# Patient Record
Sex: Female | Born: 1960
Health system: Southern US, Community
[De-identification: ages and names within clinical notes are randomized; demographics above are authoritative.]

## PROBLEM LIST (undated history)

## (undated) DIAGNOSIS — D259 Leiomyoma of uterus, unspecified: Secondary | ICD-10-CM

## (undated) DIAGNOSIS — M1990 Unspecified osteoarthritis, unspecified site: Secondary | ICD-10-CM

## (undated) DIAGNOSIS — N135 Crossing vessel and stricture of ureter without hydronephrosis: Secondary | ICD-10-CM

## (undated) DIAGNOSIS — K682 Retroperitoneal fibrosis: Secondary | ICD-10-CM

## (undated) DIAGNOSIS — L0390 Cellulitis, unspecified: Secondary | ICD-10-CM

## (undated) DIAGNOSIS — I10 Essential (primary) hypertension: Secondary | ICD-10-CM

## (undated) DIAGNOSIS — R569 Unspecified convulsions: Secondary | ICD-10-CM

## (undated) DIAGNOSIS — E669 Obesity, unspecified: Secondary | ICD-10-CM

## (undated) DIAGNOSIS — N1330 Unspecified hydronephrosis: Secondary | ICD-10-CM

## (undated) HISTORY — PX: OTHER SURGICAL HISTORY: SHX169

## (undated) HISTORY — DX: Retroperitoneal fibrosis: K68.2

## (undated) HISTORY — DX: Unspecified hydronephrosis: N13.30

## (undated) HISTORY — DX: Crossing vessel and stricture of ureter without hydronephrosis: N13.5

---

## 1988-01-16 HISTORY — PX: CERVICAL CONE BIOPSY: SUR198

## 1997-08-12 ENCOUNTER — Encounter: Admission: RE | Admit: 1997-08-12 | Discharge: 1997-08-12 | Payer: Self-pay | Admitting: Sports Medicine

## 1998-09-09 ENCOUNTER — Encounter: Admission: RE | Admit: 1998-09-09 | Discharge: 1998-09-09 | Payer: Self-pay | Admitting: Family Medicine

## 1999-10-20 ENCOUNTER — Encounter: Admission: RE | Admit: 1999-10-20 | Discharge: 1999-10-20 | Payer: Self-pay | Admitting: Family Medicine

## 1999-10-20 ENCOUNTER — Encounter: Payer: Self-pay | Admitting: Family Medicine

## 2000-11-18 ENCOUNTER — Encounter: Admission: RE | Admit: 2000-11-18 | Discharge: 2000-11-18 | Payer: Self-pay | Admitting: Family Medicine

## 2000-11-29 ENCOUNTER — Encounter: Admission: RE | Admit: 2000-11-29 | Discharge: 2000-11-29 | Payer: Self-pay | Admitting: Family Medicine

## 2000-12-06 ENCOUNTER — Encounter: Admission: RE | Admit: 2000-12-06 | Discharge: 2000-12-06 | Payer: Self-pay | Admitting: Family Medicine

## 2000-12-31 ENCOUNTER — Encounter: Admission: RE | Admit: 2000-12-31 | Discharge: 2000-12-31 | Payer: Self-pay | Admitting: Family Medicine

## 2001-01-20 ENCOUNTER — Encounter: Admission: RE | Admit: 2001-01-20 | Discharge: 2001-01-20 | Payer: Self-pay | Admitting: Family Medicine

## 2001-01-21 ENCOUNTER — Encounter: Admission: RE | Admit: 2001-01-21 | Discharge: 2001-01-21 | Payer: Self-pay | Admitting: Family Medicine

## 2001-01-29 ENCOUNTER — Encounter: Admission: RE | Admit: 2001-01-29 | Discharge: 2001-01-29 | Payer: Self-pay | Admitting: Family Medicine

## 2001-03-05 ENCOUNTER — Encounter: Admission: RE | Admit: 2001-03-05 | Discharge: 2001-03-05 | Payer: Self-pay | Admitting: Family Medicine

## 2001-09-10 ENCOUNTER — Encounter: Admission: RE | Admit: 2001-09-10 | Discharge: 2001-09-10 | Payer: Self-pay | Admitting: Family Medicine

## 2001-12-04 ENCOUNTER — Encounter: Admission: RE | Admit: 2001-12-04 | Discharge: 2001-12-04 | Payer: Self-pay | Admitting: Family Medicine

## 2001-12-04 ENCOUNTER — Encounter (INDEPENDENT_AMBULATORY_CARE_PROVIDER_SITE_OTHER): Payer: Self-pay | Admitting: *Deleted

## 2002-12-23 ENCOUNTER — Encounter: Admission: RE | Admit: 2002-12-23 | Discharge: 2002-12-23 | Payer: Self-pay | Admitting: Family Medicine

## 2003-01-06 ENCOUNTER — Encounter: Admission: RE | Admit: 2003-01-06 | Discharge: 2003-01-06 | Payer: Self-pay | Admitting: Sports Medicine

## 2004-01-21 ENCOUNTER — Encounter (INDEPENDENT_AMBULATORY_CARE_PROVIDER_SITE_OTHER): Payer: Self-pay | Admitting: *Deleted

## 2004-01-27 ENCOUNTER — Ambulatory Visit: Payer: Self-pay | Admitting: Family Medicine

## 2004-03-01 ENCOUNTER — Encounter: Admission: RE | Admit: 2004-03-01 | Discharge: 2004-03-01 | Payer: Self-pay | Admitting: Sports Medicine

## 2005-01-30 ENCOUNTER — Ambulatory Visit: Payer: Self-pay | Admitting: Family Medicine

## 2005-01-30 ENCOUNTER — Encounter: Payer: Self-pay | Admitting: Family Medicine

## 2005-03-21 ENCOUNTER — Encounter: Admission: RE | Admit: 2005-03-21 | Discharge: 2005-03-21 | Payer: Self-pay | Admitting: Family Medicine

## 2005-03-28 ENCOUNTER — Encounter: Admission: RE | Admit: 2005-03-28 | Discharge: 2005-03-28 | Payer: Self-pay | Admitting: Sports Medicine

## 2006-03-11 ENCOUNTER — Ambulatory Visit: Payer: Self-pay | Admitting: Family Medicine

## 2006-03-11 ENCOUNTER — Encounter (INDEPENDENT_AMBULATORY_CARE_PROVIDER_SITE_OTHER): Payer: Self-pay | Admitting: *Deleted

## 2006-03-11 LAB — CONVERTED CEMR LAB
Albumin: 4.1 g/dL (ref 3.5–5.2)
Alkaline Phosphatase: 49 units/L (ref 39–117)
BUN: 10 mg/dL (ref 6–23)
CO2: 22 meq/L (ref 19–32)
Calcium: 9.5 mg/dL (ref 8.4–10.5)
Cholesterol: 210 mg/dL — ABNORMAL HIGH (ref 0–200)
Glucose, Bld: 74 mg/dL (ref 70–99)
HDL: 79 mg/dL (ref >39)
Potassium: 4.1 meq/L (ref 3.5–5.3)
Sodium: 138 meq/L (ref 135–145)
Total Protein: 7 g/dL (ref 6.0–8.3)
Triglycerides: 157 mg/dL — ABNORMAL HIGH (ref <150)

## 2006-03-14 DIAGNOSIS — B351 Tinea unguium: Secondary | ICD-10-CM | POA: Insufficient documentation

## 2006-03-14 DIAGNOSIS — E669 Obesity, unspecified: Secondary | ICD-10-CM | POA: Insufficient documentation

## 2006-03-14 DIAGNOSIS — I10 Essential (primary) hypertension: Secondary | ICD-10-CM | POA: Insufficient documentation

## 2006-03-14 HISTORY — DX: Essential (primary) hypertension: I10

## 2006-03-15 ENCOUNTER — Encounter (INDEPENDENT_AMBULATORY_CARE_PROVIDER_SITE_OTHER): Payer: Self-pay | Admitting: *Deleted

## 2006-04-04 ENCOUNTER — Encounter: Admission: RE | Admit: 2006-04-04 | Discharge: 2006-04-04 | Payer: Self-pay | Admitting: Sports Medicine

## 2006-04-04 ENCOUNTER — Encounter (INDEPENDENT_AMBULATORY_CARE_PROVIDER_SITE_OTHER): Payer: Self-pay | Admitting: *Deleted

## 2007-03-11 ENCOUNTER — Encounter: Payer: Self-pay | Admitting: *Deleted

## 2007-03-24 ENCOUNTER — Ambulatory Visit: Payer: Self-pay | Admitting: Sports Medicine

## 2007-03-24 ENCOUNTER — Encounter: Payer: Self-pay | Admitting: Family Medicine

## 2007-03-24 LAB — CONVERTED CEMR LAB
BUN: 12 mg/dL (ref 6–23)
CO2: 25 meq/L (ref 19–32)
Chloride: 98 meq/L (ref 96–112)
Creatinine, Ser: 0.71 mg/dL (ref 0.40–1.20)
Glucose, Bld: 84 mg/dL (ref 70–99)
LDL Cholesterol: 92 mg/dL (ref 0–99)
Potassium: 4.3 meq/L (ref 3.5–5.3)
Triglycerides: 83 mg/dL (ref <150)
VLDL: 17 mg/dL (ref 0–40)

## 2007-04-15 ENCOUNTER — Encounter: Admission: RE | Admit: 2007-04-15 | Discharge: 2007-04-15 | Payer: Self-pay | Admitting: Family Medicine

## 2008-01-25 ENCOUNTER — Encounter: Payer: Self-pay | Admitting: Family Medicine

## 2008-01-26 ENCOUNTER — Ambulatory Visit: Payer: Self-pay | Admitting: Family Medicine

## 2008-01-26 ENCOUNTER — Encounter (INDEPENDENT_AMBULATORY_CARE_PROVIDER_SITE_OTHER): Payer: Self-pay | Admitting: Family Medicine

## 2008-01-28 ENCOUNTER — Encounter: Payer: Self-pay | Admitting: Family Medicine

## 2008-02-19 ENCOUNTER — Telehealth: Payer: Self-pay | Admitting: *Deleted

## 2008-02-20 ENCOUNTER — Telehealth: Payer: Self-pay | Admitting: Family Medicine

## 2009-03-11 ENCOUNTER — Encounter: Payer: Self-pay | Admitting: Family Medicine

## 2009-03-11 ENCOUNTER — Ambulatory Visit: Payer: Self-pay | Admitting: Family Medicine

## 2009-03-11 LAB — CONVERTED CEMR LAB
CO2: 24 meq/L (ref 19–32)
Chloride: 104 meq/L (ref 96–112)
Creatinine, Ser: 0.64 mg/dL (ref 0.40–1.20)
HDL: 81 mg/dL (ref >39)
LDL Cholesterol: 85 mg/dL (ref 0–99)
Potassium: 4 meq/L (ref 3.5–5.3)
Sodium: 140 meq/L (ref 135–145)
Total CHOL/HDL Ratio: 2.2
Triglycerides: 54 mg/dL (ref <150)

## 2009-03-13 ENCOUNTER — Encounter: Payer: Self-pay | Admitting: Family Medicine

## 2010-02-16 NOTE — Letter (Signed)
Summary: Generic Letter  [facility]  [address]   [place], [place] [address]   Phone: [phone]  Fax: [phone]    03/13/2009  Diane Clark [address] [place], [place]  [address]  Dear [name],   I just wanted to let you know that your lab results were normal.  Your cholesterol is great!  Please call me if you have any questions or concerns.           Sincerely,   Asher Muir MD  Appended Document: Generic Letter mailed.

## 2010-02-16 NOTE — Assessment & Plan Note (Signed)
Summary: cpe/pap,tcb   Vital Signs:  Patient profile:   50 year old female Weight:      200 pounds Temp:     97.6 degrees F Pulse rate:   69 / minute BP sitting:   121 / 81  Vitals Entered By: Loralee Pacas CMA (March 11, 2009 9:41 AM)  Serial Vital Signs/Assessments:  Time      Position  BP       Pulse  Resp  Temp     By                     102/65                         Asher Muir MD   CC:  cpe.  History of Present Illness: Here for cpe  Current Medications (verified): 1)  Metoprolol Succinate 100 Mg  Tb24 (Metoprolol Succinate) .... Take 1 Tablet Po Twice A Day 2)  Lisinopril 40 Mg  Tabs (Lisinopril) .... Take 1 Tablet By Mouth Dail 3)  Hydrochlorothiazide 25 Mg  Tabs (Hydrochlorothiazide) .... Take 1 Tablet By Mouth Daily  Past History:  Past Surgical History: Reviewed history from 01/26/2008 and no changes required. cervical conization `90 (no more abnl PAP`s)   Family History: B:  HTN F:  unknown M:  DM, HTN, CHF S (x4): HTN  Review of Systems General:  Denies weakness and weight loss. CV:  Denies chest pain or discomfort; no lightheadedness. Resp:  Denies cough.  Physical Exam  General:  Well-developed,well-nourished,in no acute distress; alert,appropriate and cooperative throughout examination Head:  Normocephalic and atraumatic without obvious abnormalities. No apparent alopecia or balding. Eyes:  fundoscopic exam grossly normal; PERRL Ears:  tms and canals clear Nose:  External nasal examination shows no deformity or inflammation. Nasal mucosa are pink and moist without lesions or exudates. Mouth:  Oral mucosa and oropharynx without lesions or exudates.  Teeth in good repair. Neck:  No deformities, masses, or tenderness noted. Breasts:  No mass, nodules, thickening, tenderness, bulging, retraction, inflamation, nipple discharge or skin changes noted.   Lungs:  Normal respiratory effort, chest expands symmetrically. Lungs are clear to  auscultation, no crackles or wheezes. Heart:  Normal rate and regular rhythm. S1 and S2 normal without gallop, murmur, click, rub or other extra sounds. Abdomen:  Bowel sounds positive,abdomen soft and non-tender without masses, organomegaly or hernias noted. Genitalia:  Pelvic Exam:        External: normal female genitalia without lesions or masses               Adnexa: normal bimanual exam without masses or fullness        Uterus: normal by palpation        Pap smear: not performed Msk:  No deformity or scoliosis noted of thoracic or lumbar spine.   Extremities:  No clubbing, cyanosis, edema, or deformity noted Neurologic:  no focal deficits Skin:  multiple flat moles (no changes per pt) Cervical Nodes:  No lymphadenopathy noted Axillary Nodes:  No palpable lymphadenopathy Psych:  Cognition and judgment appear intact. Alert and cooperative with normal attention span and concentration. No apparent delusions, illusions, hallucinations Additional Exam:  vital signs reviewed    Impression & Recommendations:  Problem # 1:  WELL WOMAN (ICD-V70.0) Assessment Unchanged  healthy.  advised exercise.  will think about colonoscopy.  due for mammo.  mammo sheet given.  check ldl today.  think she could go  to paps every 3 years, but she did have hx of abnormal paps with a conization >10 years ago.  all subsequent paps normal.  she prefers to have pap every2 years which I think is reasonable   Orders: [facility] - Est  40-64 yrs ([address])  Problem # 2:  OBESITY, NOS (ICD-278.00) Assessment: Unchanged  advised exercise  Orders: [facility] - Est  40-64 yrs ([address])  Problem # 3:  HYPERTENSION, BENIGN SYSTEMIC (ICD-401.1) Assessment: Unchanged  great control.  we had her metop listed as succinate, but she is actually taking metop tartrate. Her updated medication list for this problem includes:    Metoprolol Tartrate 100 Mg Tabs (Metoprolol tartrate) .Marland Kitchen... 1 tablet by mouth two times a day for blood  pressure    Lisinopril 40 Mg Tabs (Lisinopril) .Marland Kitchen... Take 1 tablet by mouth dail    Hydrochlorothiazide 25 Mg Tabs (Hydrochlorothiazide) .Marland Kitchen... Take 1 tablet by mouth daily  Orders: T-Basic Metabolic Panel [id number]) [facility] - Est  40-64 yrs ([address])  Problem # 4:  CONTRACEPTIVE MANAGEMENT (ICD-V25.09) Assessment: Unchanged think she should continue ocps for next few years.  then stop and see if in menopause.  does not think she would use condoms regularly.  not a smoker.  no hx of clots  Complete Medication List: 1)  Metoprolol Tartrate 100 Mg Tabs (Metoprolol tartrate) .Marland Kitchen.. 1 tablet by mouth two times a day for blood pressure 2)  Lisinopril 40 Mg Tabs (Lisinopril) .... Take 1 tablet by mouth dail 3)  Hydrochlorothiazide 25 Mg Tabs (Hydrochlorothiazide) .... Take 1 tablet by mouth daily 4)  Nortrel 1/35 (21) 1-35 Mg-mcg Tabs (Norethindrone-eth estradiol) .Marland Kitchen.. 1 tab by mouth daily for birth control; dispense 1 pack  Other Orders: Mammogram (Screening) (Mammo) T-Lipid Profile ([id number])  Patient Instructions: 1)  It was nice to see you today. 2)  You are very healthy.  Keep up the good work. 3)  Exercising would be great for you. 4)  Let us know if you decide you would like to get a colonoscopy. 5)  Be sure to get your mammogram. 6)  Please schedule a follow-up appointment in 6 months for blood pressure .   Prevention & Chronic Care Immunizations   Influenza vaccine: Not documented    Tetanus booster: 01/26/2008: Tdap   Tetanus booster due: 08/15/2008    Pneumococcal vaccine: Not documented  Colorectal Screening   Hemoccult: Done.  (11/15/2001)   Hemoccult due: 11/16/2002    Colonoscopy: Not documented   Colonoscopy action/deferral: Deferred  (03/11/2009)  Other Screening   Pap smear: NEGATIVE FOR INTRAEPITHELIAL LESIONS OR MALIGNANCY.  (01/26/2008)   Pap smear due: 01/25/2010    Mammogram: Normal  (04/17/2007)   Mammogram action/deferral: Ordered  (03/11/2009)    Mammogram due: 04/2008   Smoking status: quit  (01/26/2008)  Lipids   Total Cholesterol: 194  (03/24/2007)   Lipid panel action/deferral: Lipid Panel ordered   LDL: 92  (03/24/2007)   LDL Direct: Not documented   HDL: 85  (03/24/2007)   Triglycerides: 83  (03/24/2007)  Hypertension   Last Blood Pressure: 121 / 81  (03/11/2009)   Serum creatinine: 0.71  (03/24/2007)   BMP action: Ordered   Serum potassium 4.3  (03/24/2007)    Hypertension flowsheet reviewed?: Yes   Progress toward BP goal: At goal    Stage of readiness to change (hypertension management): Action   Hypertension comments: repeat bp 102/65  Self-Management Support :   Personal Goals (by the next clinic visit) :  Personal blood pressure goal: 140/90  (03/11/2009)   Hypertension self-management support: Not documented    Hypertension self-management support not done because: Good outcomes  (03/11/2009)   Nursing Instructions: Schedule screening mammogram (see order)

## 2010-03-14 ENCOUNTER — Other Ambulatory Visit: Payer: Self-pay | Admitting: Family Medicine

## 2010-03-14 ENCOUNTER — Encounter: Payer: Self-pay | Admitting: Family Medicine

## 2010-03-14 ENCOUNTER — Ambulatory Visit (INDEPENDENT_AMBULATORY_CARE_PROVIDER_SITE_OTHER): Payer: BC Managed Care – PPO | Admitting: Family Medicine

## 2010-03-14 DIAGNOSIS — B351 Tinea unguium: Secondary | ICD-10-CM

## 2010-03-14 DIAGNOSIS — E28319 Asymptomatic premature menopause: Secondary | ICD-10-CM

## 2010-03-14 DIAGNOSIS — Z780 Asymptomatic menopausal state: Secondary | ICD-10-CM

## 2010-03-14 DIAGNOSIS — N951 Menopausal and female climacteric states: Secondary | ICD-10-CM

## 2010-03-14 DIAGNOSIS — I10 Essential (primary) hypertension: Secondary | ICD-10-CM

## 2010-03-14 DIAGNOSIS — Z0000 Encounter for general adult medical examination without abnormal findings: Secondary | ICD-10-CM

## 2010-03-14 DIAGNOSIS — Z1231 Encounter for screening mammogram for malignant neoplasm of breast: Secondary | ICD-10-CM

## 2010-03-14 LAB — CONVERTED CEMR LAB
BUN: 14 mg/dL (ref 6–23)
CO2: 24 meq/L (ref 19–32)
Creatinine, Ser: 0.71 mg/dL (ref 0.40–1.20)
FSH: 5.4 milliintl units/mL
Glucose, Bld: 82 mg/dL (ref 70–99)
HCT: 40.4 % (ref 36.0–46.0)
LH: 1.6 milliintl units/mL
MCV: 89.8 fL (ref 78.0–100.0)
RBC: 4.5 M/uL (ref 3.87–5.11)
Total Bilirubin: 0.3 mg/dL (ref 0.3–1.2)
WBC: 6.5 10*3/uL (ref 4.0–10.5)

## 2010-03-14 LAB — CBC
HCT: 40.4 % (ref 36.0–46.0)
MCHC: 33.4 g/dL (ref 30.0–36.0)
Platelets: 291 10*3/uL (ref 150–400)
RDW: 13.4 % (ref 11.5–15.5)

## 2010-03-14 LAB — COMPREHENSIVE METABOLIC PANEL
ALT: 16 U/L (ref 0–35)
AST: 18 U/L (ref 0–37)
Alkaline Phosphatase: 52 U/L (ref 39–117)
BUN: 14 mg/dL (ref 6–23)
Creat: 0.71 mg/dL (ref 0.40–1.20)
Potassium: 4.4 mEq/L (ref 3.5–5.3)

## 2010-03-14 MED ORDER — FLUCONAZOLE 150 MG PO TABS: 150.0000 mg | ORAL_TABLET | Freq: Once | ORAL | Status: DC

## 2010-03-19 NOTE — Progress Notes (Signed)
  Subjective:    Patient ID: Diane Clark, female    DOB: 04/22/1960, 50 y.o.   MRN: [id number]  HPI Patient presents to clinic for a complete physical exam.  Patient doing well.  Only complains of feeling light-headed occasionally.  No associated symptoms.  Patient believes it could be due to anti-hypertensives.  Patient denies any recent illnesses or visits to ED/hospital.  Wants to discuss onychomycosis which is a chronic issue.  Has declined anti-fungal meds in the past due to side effects and close monitoring.  Does not want symptoms to become worse.  Patient had a pap smear last year which was normal.  She agrees to do the next pap smear in one year.  Does not need any med refills at this time.    Review of Systems Denies fever, chills, CP, N/V.  Denies abdominal pain, constipation/diarrhea, dysuria.    Objective:   Physical Exam  Constitutional: She is oriented to person, place, and time. She appears well-developed and well-nourished.  HENT:  Head: Normocephalic and atraumatic.  Eyes: Conjunctivae and EOM are normal. Pupils are equal, round, and reactive to light.  Neck: Neck supple. No JVD present.  Cardiovascular: Normal rate and regular rhythm.  Exam reveals no gallop and no friction rub.   No murmur heard. Pulmonary/Chest: Breath sounds normal. She has no wheezes. She has no rales.  Abdominal: Soft. Bowel sounds are normal. She exhibits no distension and no mass. There is no tenderness.  Musculoskeletal: Normal range of motion. She exhibits no edema.  Lymphadenopathy:    She has no cervical adenopathy.  Neurological: She is alert and oriented to person, place, and time. She has normal reflexes.  Skin: Skin is warm and dry.  Psychiatric: She has a normal mood and affect.          Assessment & Plan:

## 2010-03-20 ENCOUNTER — Telehealth: Payer: Self-pay | Admitting: Family Medicine

## 2010-03-20 DIAGNOSIS — I10 Essential (primary) hypertension: Secondary | ICD-10-CM

## 2010-03-20 MED ORDER — METOPROLOL TARTRATE 100 MG PO TABS: 100.0000 mg | ORAL_TABLET | Freq: Two times a day (BID) | ORAL | Status: DC

## 2010-03-20 MED ORDER — NORETHINDRONE-ETH ESTRADIOL 1-35 MG-MCG PO TABS: 1.0000 | ORAL_TABLET | Freq: Every day | ORAL | Status: DC

## 2010-03-20 NOTE — Telephone Encounter (Addendum)
Patient was in to see dr. Tye Savoy on 03/14/2010.  Will contact MD to ask about refills .  spoke with MD and she will enter in Rx and [facility].   patient also needs RX for lisinopril send locally to [facility] on [address] because she will not have enough to last until she [facility]. Will ask MD to do this also.

## 2010-03-20 NOTE — Telephone Encounter (Signed)
States that [facility] have [facility]ed in request for her meds and haven't heard back.  She had just talked to them and they told her that we would have to fax in refill: Lisinopril Diflucan Metoprolol HCTZ Nortel  Her member ID# [id number] Fax # [phone]

## 2010-03-21 DIAGNOSIS — Z0000 Encounter for general adult medical examination without abnormal findings: Secondary | ICD-10-CM | POA: Insufficient documentation

## 2010-03-21 MED ORDER — HYDROCHLOROTHIAZIDE 25 MG PO TABS: 25.0000 mg | ORAL_TABLET | Freq: Every day | ORAL | Status: DC

## 2010-03-21 MED ORDER — LISINOPRIL 40 MG PO TABS: 40.0000 mg | ORAL_TABLET | Freq: Every day | ORAL | Status: DC

## 2010-03-21 MED ORDER — METOPROLOL TARTRATE 100 MG PO TABS: 100.0000 mg | ORAL_TABLET | Freq: Two times a day (BID) | ORAL | Status: DC

## 2010-03-21 NOTE — Telephone Encounter (Signed)
Patient notified that rx have been refilled electronically  to [facility] and RN sent in rx to local pharmacy for lisinopril.

## 2010-03-21 NOTE — Assessment & Plan Note (Signed)
Patient's condition is moderate.  Onychomycosis limited to L big toe and 2nd toe.  Will call pharmacy and give verbal order for Diflucan 150mg  1 tab per week for 9 months.  Patient agreed to Diflucan because it has less side effects and monitoring for LFTs.  Will follow up in 4 months.

## 2010-04-14 ENCOUNTER — Telehealth: Payer: Self-pay | Admitting: Family Medicine

## 2010-04-14 NOTE — Telephone Encounter (Signed)
Needs refill for 90 days on her Nortrel - can't have it as 3 refills, must be for 90 days - [facility] - fax [phone] Phn# [phone]  Diflucan-they will on only pay for 2 tablets at a time, needs it called again for 2 more or however she can help her get it for 9 months.  The doctor may have to call and speak with pharmacy

## 2010-05-08 ENCOUNTER — Telehealth: Payer: Self-pay | Admitting: Family Medicine

## 2010-05-08 DIAGNOSIS — I10 Essential (primary) hypertension: Secondary | ICD-10-CM

## 2010-05-08 MED ORDER — LISINOPRIL 40 MG PO TABS: 40.0000 mg | ORAL_TABLET | Freq: Every day | ORAL | Status: DC

## 2010-05-08 MED ORDER — METOPROLOL TARTRATE 100 MG PO TABS: 100.0000 mg | ORAL_TABLET | Freq: Two times a day (BID) | ORAL | Status: DC

## 2010-05-08 MED ORDER — NORETHINDRONE-ETH ESTRADIOL 1-35 MG-MCG PO TABS: 1.0000 | ORAL_TABLET | Freq: Every day | ORAL | Status: DC

## 2010-05-08 MED ORDER — FLUCONAZOLE 150 MG PO TABS: 150.0000 mg | ORAL_TABLET | Freq: Once | ORAL | Status: DC

## 2010-05-08 NOTE — Telephone Encounter (Signed)
Pt uses [facility] and she needs enough meds to last her for the year - she was here 2/28 and had spoken to the doc about it.  All of her meds needs to be sent in for at least a 90 day supply and then refills for the rest of the year. Even the Diflucan needs to be changed.

## 2010-05-08 NOTE — Telephone Encounter (Signed)
Hi Denise, I re-ordered all of her meds and sent electronically to pharmacy.  Quantity: 90 with 12 refills.  Please let patient know she can pick them up whenever the pharmacy is ready. Thanks, [employer]

## 2010-06-08 ENCOUNTER — Telehealth: Payer: Self-pay | Admitting: Family Medicine

## 2010-06-08 ENCOUNTER — Other Ambulatory Visit: Payer: Self-pay | Admitting: Family Medicine

## 2010-06-08 DIAGNOSIS — I10 Essential (primary) hypertension: Secondary | ICD-10-CM

## 2010-06-08 MED ORDER — LISINOPRIL 40 MG PO TABS: 40.0000 mg | ORAL_TABLET | Freq: Every day | ORAL | Status: DC

## 2010-06-08 MED ORDER — METOPROLOL TARTRATE 100 MG PO TABS: 100.0000 mg | ORAL_TABLET | Freq: Two times a day (BID) | ORAL | Status: DC

## 2010-06-08 NOTE — Telephone Encounter (Signed)
Pt calling re: all her meds, says MD sent in refills wrong, pt likes to get 3 month supplies & the qty needs to be increased to meet the 3 month supply & include additional refills. Please call pt with questions, [facility]. Pt specifically needs metoprolol 2 x daily qty 180 with 3 refills.

## 2010-06-08 NOTE — Telephone Encounter (Signed)
I refilled metoprolol and lisinopril electronically, 3 mo supply, 3 refills to [facility] as patient requests.

## 2010-06-09 NOTE — Telephone Encounter (Signed)
lvm the Rz request has been sent.Laureen Ochs, Viann Shove

## 2010-06-15 ENCOUNTER — Other Ambulatory Visit: Payer: Self-pay | Admitting: Anesthesiology

## 2010-06-15 ENCOUNTER — Other Ambulatory Visit: Payer: Self-pay | Admitting: Family Medicine

## 2010-06-15 DIAGNOSIS — Z1231 Encounter for screening mammogram for malignant neoplasm of breast: Secondary | ICD-10-CM

## 2010-06-22 ENCOUNTER — Ambulatory Visit
Admission: RE | Admit: 2010-06-22 | Discharge: 2010-06-22 | Disposition: A | Discharge status: Home / Self Care | Payer: BC Managed Care – PPO | Source: Ambulatory Visit | Attending: Family Medicine | Admitting: Family Medicine

## 2010-06-22 DIAGNOSIS — Z1231 Encounter for screening mammogram for malignant neoplasm of breast: Secondary | ICD-10-CM

## 2010-06-26 ENCOUNTER — Telehealth: Payer: Self-pay | Admitting: Family Medicine

## 2010-06-26 ENCOUNTER — Telehealth: Payer: Self-pay | Admitting: *Deleted

## 2010-06-26 MED ORDER — NORETHINDRONE-ETH ESTRADIOL 1-35 MG-MCG PO TABS: 1.0000 | ORAL_TABLET | Freq: Every day | ORAL | Status: DC

## 2010-06-26 NOTE — Telephone Encounter (Signed)
Ms. Balash has called because the prescription for Nortel/Cyclesem was written incorrectly.  The prescription needs to read for 84 tablets and 3 refills and needs to be faxed to [facility] at [phone].

## 2010-06-26 NOTE — Telephone Encounter (Signed)
Ms. Gorum has called because the prescription for Nortel/Cyclesem was written incorrectly.  The prescription needs to read for 84 tablets and 3 refills and needs to be faxed to [facility] at [phone]. °

## 2011-02-20 ENCOUNTER — Ambulatory Visit (INDEPENDENT_AMBULATORY_CARE_PROVIDER_SITE_OTHER): Payer: BC Managed Care – PPO | Admitting: Family Medicine

## 2011-02-20 ENCOUNTER — Encounter: Payer: Self-pay | Admitting: Family Medicine

## 2011-02-20 DIAGNOSIS — B351 Tinea unguium: Secondary | ICD-10-CM

## 2011-02-20 DIAGNOSIS — I10 Essential (primary) hypertension: Secondary | ICD-10-CM

## 2011-02-20 LAB — CBC
MCV: 89.4 fL (ref 78.0–100.0)
Platelets: 294 10*3/uL (ref 150–400)
RBC: 4.51 MIL/uL (ref 3.87–5.11)
WBC: 5.5 10*3/uL (ref 4.0–10.5)

## 2011-02-20 LAB — COMPREHENSIVE METABOLIC PANEL
ALT: 15 U/L (ref 0–35)
AST: 15 U/L (ref 0–37)
Albumin: 4.2 g/dL (ref 3.5–5.2)
Calcium: 9.1 mg/dL (ref 8.4–10.5)
Chloride: 101 mEq/L (ref 96–112)
Creat: 0.61 mg/dL (ref 0.50–1.10)
Potassium: 4 mEq/L (ref 3.5–5.3)
Sodium: 137 mEq/L (ref 135–145)
Total Protein: 7 g/dL (ref 6.0–8.3)

## 2011-02-20 LAB — LIPID PANEL: Total CHOL/HDL Ratio: 2.6 Ratio

## 2011-02-20 MED ORDER — METOPROLOL TARTRATE 100 MG PO TABS: 100.0000 mg | ORAL_TABLET | Freq: Two times a day (BID) | ORAL | Status: DC

## 2011-02-20 MED ORDER — LISINOPRIL 40 MG PO TABS: 40.0000 mg | ORAL_TABLET | Freq: Every day | ORAL | Status: DC

## 2011-02-20 MED ORDER — HYDROCHLOROTHIAZIDE 25 MG PO TABS: 25.0000 mg | ORAL_TABLET | Freq: Every day | ORAL | Status: DC

## 2011-02-20 NOTE — Assessment & Plan Note (Signed)
Due to patient's medical noncompliance and hesitancy to take oral medication, will refer to podiatry.

## 2011-02-20 NOTE — Progress Notes (Signed)
  Subjective:    Patient ID: Diane Clark, female    DOB: [birth year], 51 y.o.   MRN: [id number]  HPI  Patient presents to clinic for medication refills and toe nail fungus followup.  Hypertension: Patient has not been to clinic in over a year. She has been getting refills of her antihypertensive medications. Currently taking lisinopril 40 mg daily, metoprolol 100 mg twice daily, and hydrochlorothiazide 25 mg daily. Current blood pressure today 128/76. Patient denies any headache, nausea or vomiting, blurry vision, chest pain, fatigue, numbness or tingling of extremities. Patient has no complaints and denies any side effects of medications.  Onychomycosis: Patient was to take Diflucan one tablet per week for 9 months. Patient was not interested in taking any other antifungal medication at last visit. Patient admits to not taking Diflucan every week. She is still not interested in taking any other oral antifungal medication. States for fungus has not improved. Denies any foot pain or worsening symptoms.  Review of Systems  Her history of present illness    Objective:   Physical Exam  Constitutional: No distress.  HENT:  Head: Normocephalic and atraumatic.  Neck: Normal range of motion. No JVD present.  Cardiovascular: Normal rate and normal heart sounds.   Pulmonary/Chest: Effort normal and breath sounds normal.  Musculoskeletal: Normal range of motion. She exhibits no edema.  Skin: Skin is warm and dry.  Psychiatric: Her mood appears anxious.  Foot exam: Toenail fungus appreciated on on all 5 toenails, left foot.        Assessment & Plan:

## 2011-02-20 NOTE — Assessment & Plan Note (Signed)
Blood pressure well controlled. Continue current regimen.  

## 2011-02-20 NOTE — Patient Instructions (Signed)
It was good to see you today. I will send you a letter with recent lab results. Pick up medications at [facility] and take as directed. We will call you for time and date of podiatry appointment. You are due for a complete physical - schedule one at your earliest convenience. If you experience headache, vomiting, blurry vision, chest pain, or SOB, please call MD or go to ED.

## 2011-02-21 ENCOUNTER — Encounter: Payer: Self-pay | Admitting: Family Medicine

## 2011-11-08 ENCOUNTER — Other Ambulatory Visit: Payer: Self-pay | Admitting: Family Medicine

## 2012-03-10 ENCOUNTER — Other Ambulatory Visit: Payer: Self-pay | Admitting: Family Medicine

## 2012-10-29 ENCOUNTER — Other Ambulatory Visit: Payer: Self-pay | Admitting: Family Medicine

## 2013-01-29 ENCOUNTER — Other Ambulatory Visit: Payer: Self-pay | Admitting: Family Medicine

## 2013-01-29 ENCOUNTER — Telehealth: Payer: Self-pay | Admitting: Family Medicine

## 2013-01-29 MED ORDER — LISINOPRIL 40 MG PO TABS: 40.0000 mg | ORAL_TABLET | Freq: Every day | ORAL | Status: DC

## 2013-01-29 NOTE — Telephone Encounter (Signed)
LVM for patient to call back. ?

## 2013-01-29 NOTE — Telephone Encounter (Signed)
Please cal pt and inform I will refill her prescription request. She MUST make an appointment to be seen. She has not been seen in almost 2 years. Thanks.

## 2013-03-26 ENCOUNTER — Other Ambulatory Visit: Payer: Self-pay | Admitting: Family Medicine

## 2013-03-31 ENCOUNTER — Other Ambulatory Visit: Payer: Self-pay | Admitting: Podiatry

## 2013-04-02 ENCOUNTER — Other Ambulatory Visit: Payer: Self-pay | Admitting: Podiatry

## 2013-04-09 ENCOUNTER — Encounter: Payer: Self-pay | Admitting: Family Medicine

## 2013-04-09 ENCOUNTER — Other Ambulatory Visit (HOSPITAL_COMMUNITY)
Admission: RE | Admit: 2013-04-09 | Discharge: 2013-04-09 | Disposition: A | Discharge status: Home / Self Care | Payer: BC Managed Care – PPO | Source: Ambulatory Visit | Attending: Family Medicine | Admitting: Family Medicine

## 2013-04-09 ENCOUNTER — Ambulatory Visit (INDEPENDENT_AMBULATORY_CARE_PROVIDER_SITE_OTHER): Payer: BC Managed Care – PPO | Admitting: Family Medicine

## 2013-04-09 VITALS — BP 120/59 | HR 68 | Temp 98.1°F | Ht 67.0 in | Wt 207.0 lb

## 2013-04-09 DIAGNOSIS — Z01419 Encounter for gynecological examination (general) (routine) without abnormal findings: Secondary | ICD-10-CM | POA: Insufficient documentation

## 2013-04-09 DIAGNOSIS — Z124 Encounter for screening for malignant neoplasm of cervix: Secondary | ICD-10-CM

## 2013-04-09 DIAGNOSIS — I10 Essential (primary) hypertension: Secondary | ICD-10-CM

## 2013-04-09 DIAGNOSIS — Z0000 Encounter for general adult medical examination without abnormal findings: Secondary | ICD-10-CM

## 2013-04-09 LAB — CBC
HEMATOCRIT: 39.5 % (ref 36.0–46.0)
HEMOGLOBIN: 13.5 g/dL (ref 12.0–15.0)
MCH: 29.2 pg (ref 26.0–34.0)
MCHC: 34.2 g/dL (ref 30.0–36.0)
MCV: 85.5 fL (ref 78.0–100.0)
Platelets: 298 10*3/uL (ref 150–400)
RBC: 4.62 MIL/uL (ref 3.87–5.11)
RDW: 13.9 % (ref 11.5–15.5)
WBC: 4.5 10*3/uL (ref 4.0–10.5)

## 2013-04-09 LAB — LIPID PANEL
Cholesterol: 225 mg/dL — ABNORMAL HIGH (ref 0–200)
HDL: 96 mg/dL (ref >39)
LDL Cholesterol: 119 mg/dL — ABNORMAL HIGH (ref 0–99)
Total CHOL/HDL Ratio: 2.3 Ratio
Triglycerides: 52 mg/dL (ref <150)
VLDL: 10 mg/dL (ref 0–40)

## 2013-04-09 LAB — COMPLETE METABOLIC PANEL WITH GFR
ALBUMIN: 4.2 g/dL (ref 3.5–5.2)
ALT: 24 U/L (ref 0–35)
AST: 22 U/L (ref 0–37)
Alkaline Phosphatase: 77 U/L (ref 39–117)
BUN: 9 mg/dL (ref 6–23)
CALCIUM: 9.6 mg/dL (ref 8.4–10.5)
CHLORIDE: 100 meq/L (ref 96–112)
CO2: 29 mEq/L (ref 19–32)
CREATININE: 0.58 mg/dL (ref 0.50–1.10)
GLUCOSE: 96 mg/dL (ref 70–99)
POTASSIUM: 3.9 meq/L (ref 3.5–5.3)
Sodium: 138 mEq/L (ref 135–145)
Total Bilirubin: 0.4 mg/dL (ref 0.2–1.2)
Total Protein: 7.3 g/dL (ref 6.0–8.3)

## 2013-04-09 LAB — TSH: TSH: 0.881 u[IU]/mL (ref 0.350–4.500)

## 2013-04-09 MED ORDER — METOPROLOL TARTRATE 100 MG PO TABS: 100.0000 mg | ORAL_TABLET | Freq: Two times a day (BID) | ORAL | Status: DC

## 2013-04-09 MED ORDER — HYDROCHLOROTHIAZIDE 25 MG PO TABS: 25.0000 mg | ORAL_TABLET | Freq: Every day | ORAL | Status: DC

## 2013-04-09 MED ORDER — LISINOPRIL 40 MG PO TABS: 40.0000 mg | ORAL_TABLET | Freq: Every day | ORAL | Status: DC

## 2013-04-09 NOTE — Assessment & Plan Note (Signed)
As his blood pressure is well controlled on her 3 drug regimen of HCTZ, lisinopril and metoprolol. Will continue current regimen. Refills called in today. Need to see the patient in 6 months.

## 2013-04-09 NOTE — Assessment & Plan Note (Signed)
Completed today. Will call patient with the results. Menopause for 2 years, currently with some hot flashes. She states they're tolerable and she really hasn't tried anything for them at this time.  Mammogram ordered. Colonoscopy information given patient will call back with where she would like the screening colonoscopy placed. CBC, BMP and lipids obtained today.

## 2013-04-09 NOTE — Patient Instructions (Signed)

## 2013-04-09 NOTE — Progress Notes (Signed)
   Subjective:    Patient ID: Diane Clark, female    DOB: [birth year], 53 y.o.   MRN: [id number]  HPI  Hypertension:  Patient is currently taking HCTZ 25 mg a day, metoprolol 100 mg bid and Lisinopril 40 QD. Patient states she is doing well. CBC, CMP, lipid overdue. She denies chest pain, shortness of breath, headache, palpitations or leg edema.  Well women: - Mam: 2012. Normal with some calcifications and densities, repeat should be yearly.  - PAP: 2006, last PAP. Patient states she had a leep cone procedure 20 years ago and since her pap has been normal.  - Colonoscopy: No family history of colon cancer. Patient reports she remains mildly constipated. Takes daily fiber. No changes in stool, color or caliber.  - LMP: Stopped BCP in 2013 and periods stopped at that time. Patient states she has "severe" hot flashes now.  - Patient admits to intermittent dyspareunia. She states the pain is more deep in her pelvis, and only occurs occasionally. She states that extremely painful but more bothersome. Her partner do not use any lubricants. She has been with the same partner for 20 years. She denies any vaginal discharge or discomfort. She denies abdominal pain.  Social: Relationship of 12 years with female partner. 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   Review of Systems  Negative, with the exception of above mentioned in HPI  Objective:   Physical Exam BP 120/59  Pulse 68  Temp(Src) 98.1 F (36.7 C) (Oral)  Ht 5\' 7"  (1.702 m)  Wt 207 lb (93.895 kg)  BMI 32.41 kg/m2 Gen: NAD.  HEENT: AT. [place]. Bilateral TM visualized and normal in appearance. Bilateral eyes without injections or icterus. MMM. Bilateral nares without erythema. Throat without erythema or exudates.  No lymphadenopathy. No enlargement of thyroid CV: RRR, no murmurs clicks gallops or rubs Chest: CTAB, no wheeze or crackles Abd: Soft. Obese. NTND. BS present. No Masses palpated.  Ext: No erythema.  no edema.  Skin: No  rashes, purpura or petechiae.  Neuro:  Normal gait. PERLA. EOMi. Alert. Grossly intact.  Psych: Normal affect, demeanor , mood and speech   GYN:  External genitalia within normal limits.  Vaginal mucosa pink, moist, normal rugae.  Nonfriable cervix without lesions, no discharge or bleeding noted on speculum exam.  Bimanual exam revealed normal, nongravid uterus.  No cervical motion tenderness. No adnexal masses bilaterally.

## 2013-04-11 ENCOUNTER — Encounter: Payer: Self-pay | Admitting: Family Medicine

## 2013-06-26 ENCOUNTER — Other Ambulatory Visit: Payer: Self-pay | Admitting: Family Medicine

## 2013-09-28 ENCOUNTER — Other Ambulatory Visit: Payer: Self-pay | Admitting: Family Medicine

## 2014-03-26 ENCOUNTER — Other Ambulatory Visit: Payer: Self-pay | Admitting: Family Medicine

## 2014-06-20 ENCOUNTER — Other Ambulatory Visit: Payer: Self-pay | Admitting: Family Medicine

## 2015-03-14 ENCOUNTER — Other Ambulatory Visit: Payer: Self-pay | Admitting: *Deleted

## 2015-03-14 MED ORDER — LISINOPRIL 40 MG PO TABS: 40.0000 mg | ORAL_TABLET | Freq: Every day | ORAL | Status: DC

## 2015-03-14 MED ORDER — METOPROLOL SUCCINATE ER 100 MG PO TB24: 100.0000 mg | ORAL_TABLET | Freq: Two times a day (BID) | ORAL | Status: DC

## 2015-03-14 MED ORDER — HYDROCHLOROTHIAZIDE 25 MG PO TABS: 25.0000 mg | ORAL_TABLET | Freq: Every day | ORAL | Status: DC

## 2015-03-14 NOTE — Telephone Encounter (Signed)
I have given Rx for 1 month supply for refills. Appears patient has not been seen since 2015. Will need to make an appointment for management of chronic medical conditions prior to receiving more medications. Please call patient and inform her of this and schedule an appointment with Dr. Cyndia Skeeters (PCP) in the next month. Thanks. Phill Myron, D.O. 03/14/2015, 1:44 PM PGY-1, [place]

## 2015-03-21 NOTE — Telephone Encounter (Signed)
Pt made aware of message from Dr. Juleen China.  She made appointment 04/19/15 (as her appt needed to be after 3/26 for insurance reasons).  She wants to make sure MD is aware that she made appointment, but will need refills before then. [place], Salome Spotted

## 2015-04-19 ENCOUNTER — Encounter: Payer: Self-pay | Admitting: Student

## 2015-04-19 ENCOUNTER — Ambulatory Visit (INDEPENDENT_AMBULATORY_CARE_PROVIDER_SITE_OTHER): Payer: BLUE CROSS/BLUE SHIELD | Admitting: Student

## 2015-04-19 VITALS — BP 109/70 | HR 78 | Temp 98.2°F | Ht 67.0 in | Wt 200.0 lb

## 2015-04-19 DIAGNOSIS — I10 Essential (primary) hypertension: Secondary | ICD-10-CM | POA: Diagnosis not present

## 2015-04-19 DIAGNOSIS — Z0000 Encounter for general adult medical examination without abnormal findings: Secondary | ICD-10-CM | POA: Diagnosis not present

## 2015-04-19 LAB — BASIC METABOLIC PANEL WITH GFR
BUN: 11 mg/dL (ref 7–25)
CHLORIDE: 101 mmol/L (ref 98–110)
CO2: 29 mmol/L (ref 20–31)
Calcium: 9.3 mg/dL (ref 8.6–10.4)
Creat: 0.63 mg/dL (ref 0.50–1.05)
GFR, Est Non African American: >89 mL/min (ref >=60)
GLUCOSE: 87 mg/dL (ref 65–99)
POTASSIUM: 3.6 mmol/L (ref 3.5–5.3)
Sodium: 139 mmol/L (ref 135–146)

## 2015-04-19 LAB — LIPID PANEL
CHOL/HDL RATIO: 2.5 ratio (ref <=5.0)
Cholesterol: 221 mg/dL — ABNORMAL HIGH (ref 125–200)
HDL: 88 mg/dL (ref >=46)
LDL Cholesterol: 119 mg/dL (ref <130)
Triglycerides: 71 mg/dL (ref <150)
VLDL: 14 mg/dL (ref <30)

## 2015-04-19 LAB — POCT GLYCOSYLATED HEMOGLOBIN (HGB A1C): Hemoglobin A1C: 5.6

## 2015-04-19 MED ORDER — METOPROLOL SUCCINATE ER 100 MG PO TB24: 100.0000 mg | ORAL_TABLET | Freq: Two times a day (BID) | ORAL | Status: DC

## 2015-04-19 MED ORDER — LISINOPRIL 40 MG PO TABS: 40.0000 mg | ORAL_TABLET | Freq: Every day | ORAL | Status: DC

## 2015-04-19 MED ORDER — HYDROCHLOROTHIAZIDE 25 MG PO TABS: 25.0000 mg | ORAL_TABLET | Freq: Every day | ORAL | Status: DC

## 2015-04-19 NOTE — Assessment & Plan Note (Signed)
Chronic conditions: -Hypertension: well controlled. Refilled her medication. Will consider taking off one of her blood pressure meds if continues to on the low side.  - will check Bmet, lipid panel and A1c today  Screenings -Colon cancer: patient interested in stool card vs colonoscopy. Gave her the card to day -Breast cancer: patient to call and schedule an appointment for mammogram. Gave information including phone numbers -Pap smear: normal pap smear on 04/10/2015 -HIV and Hep C: will screen today   Staying healthy:  Exercise: recommend 150 mins of moderate intensity exercise weekly and gave handout on this. Diet: portion size and regular meals. Gave handout. Eye exam: recommended eye exam once a year Dental exam: recommended dental exam twice a year   Alcohol use:  -Discussed moderation (no more than 1 drink a day)  Numbness on right thigh: reassured patient. Doesn't appear to be neurologic. Likely exercise induced. Advised to return if changes.

## 2015-04-19 NOTE — Progress Notes (Signed)
   Subjective:    Patient ID: Diane Clark, female    DOB: [birth year], 55 y.o.   MRN: [id number]  HPI  Diane Clark is a 55 yo F with history of hypertension here for annual physical. Today she reports numbeness on her right thigh for about a year. No weakness, pain, swelling in her legs. Denies trauma or injury. Denies fever or history of back injury or pain. She reports taking stairs daily and thinks this could be contributing. She walks for about 20-30 minutes everyday.   She has pap smear in 2017 which was normal. Never had colonoscopy. Had mammogram in the past, doesn't remember the exact time.  Hypertension: reports taking her blood pressure. Not clear who has been prescribing her medication as she wasn't seen at our clinic in about two years.  Family history: diabetes in her father, heart failure in her mother but at old age (not sure exact age). No history of cancer. Social history: denies smoking cigarette, drinks about 3-4 bears in a one to two weeks, denies recreational drug use. She lives with her friend.   ROS Objective:   Physical Exam Filed Vitals:   04/19/15 1614  BP: 109/70  Pulse: 78  Temp: 98.2 F (36.8 C)  TempSrc: Oral  Height: 5\' 7"  (1.702 m)  Weight: 200 lb (90.719 kg)   Gen: appears well Eyes: no conjunctival redness, sclera anicteric  Ears: TM's and ear canals appear normal Nares: clear, no erythema, swelling or congestion Oropharynx: clear, moist Neck: supple, no LAD CV: regular rate and rythm. S1 & S2 audible, no murmurs. Resp: no apparent work of breathing, clear to auscultation bilaterally. GI: bowel sounds normal, no tenderness to palpation, no rebound or guarding, no mass.  GU: no suprapubic tenderness Skin: no lesion MSK: the two legs appear symmetric, no swelling, no tenderness to palpation,  Neuro: alert and oriented, no gross deficit, motor 5/5 in both legs, sensation intact, gait normal.    Assessment & Plan:  Routine health  maintenance Chronic conditions: -Hypertension: well controlled. Refilled her medication. Will consider taking off one of her blood pressure meds if continues to on the low side.  - will check Bmet, lipid panel and A1c today  Screenings -Colon cancer: patient interested in stool card vs colonoscopy. Gave her the card to day -Breast cancer: patient to call and schedule an appointment for mammogram. Gave information including phone numbers -Pap smear: normal pap smear on 04/10/2015 -HIV and Hep C: will screen today   Staying healthy:  Exercise: recommend 150 mins of moderate intensity exercise weekly and gave handout on this. Diet: portion size and regular meals. Gave handout. Eye exam: recommended eye exam once a year Dental exam: recommended dental exam twice a year   Alcohol use:  -Discussed moderation (no more than 1 drink a day)  Numbness on right thigh: reassured patient. Doesn't appear to be neurologic. Likely exercise induced. Advised to return if changes.

## 2015-04-19 NOTE — Patient Instructions (Signed)
It was great seeing you today! We have addressed the following issues today  1. Blood pressure: is at goal. Continue taking your medications. I have sent refills to pharmacy. 2. Numbness over your thigh: not clear what is causing this. However, it doesn't sound to be life threatening. I have ordered some labs to check your electrolyte as this could cause such symptoms. 3. Staying healthy: ordered some general tests. I also recommend you get your mammogram done. 4. Diet and exercise tips: see below    If we did any lab work today, and the results require attention, either me or my nurse will get in touch with you. If everything is normal, you will get a letter in mail. If you don't hear from [place] in two weeks, please give [place] a call. Otherwise, I look forward to talking with you again at our next visit. If you have any questions or concerns before then, please call the clinic at [phone].  Please bring all your medications to every doctors visit   Sign up for My Chart to have easy access to your labs results, and communication with your Primary care physician.    Please check-out at the front desk before leaving the clinic.   Take Care,      Exercise Guide:

## 2015-04-20 ENCOUNTER — Encounter: Payer: Self-pay | Admitting: Student

## 2015-04-20 LAB — HIV ANTIBODY (ROUTINE TESTING W REFLEX): HIV 1&2 Ab, 4th Generation: NONREACTIVE

## 2015-04-20 LAB — HEPATITIS C ANTIBODY: HCV AB: NEGATIVE

## 2015-04-23 ENCOUNTER — Telehealth: Payer: Self-pay | Admitting: Student

## 2015-04-23 NOTE — Telephone Encounter (Signed)
Called patient about her BP. Patient is on three blood pressure medications. Her blood pressures has been at 120's/80's with recent one being 109/70. I suggested taking HCTZ off as her K is borderline and BP is on the low side. I advised her to call front office to schedule nurse visit for BP check in two weeks. If blood pressure continues to be stable, I will reduce her Toprol XL to 50 mg in about a month.

## 2015-05-10 ENCOUNTER — Other Ambulatory Visit: Payer: Self-pay

## 2015-05-10 DIAGNOSIS — Z1231 Encounter for screening mammogram for malignant neoplasm of breast: Secondary | ICD-10-CM

## 2015-05-13 ENCOUNTER — Ambulatory Visit (INDEPENDENT_AMBULATORY_CARE_PROVIDER_SITE_OTHER): Payer: BLUE CROSS/BLUE SHIELD | Admitting: *Deleted

## 2015-05-13 VITALS — BP 132/84 | HR 63

## 2015-05-13 DIAGNOSIS — Z0130 Encounter for examination of blood pressure without abnormal findings: Secondary | ICD-10-CM

## 2015-05-13 DIAGNOSIS — Z136 Encounter for screening for cardiovascular disorders: Secondary | ICD-10-CM

## 2015-05-13 LAB — POC HEMOCCULT BLD/STL (HOME/3-CARD/SCREEN)
FECAL OCCULT BLD: NEGATIVE
FECAL OCCULT BLD: NEGATIVE
FECAL OCCULT BLD: NEGATIVE

## 2015-05-13 NOTE — Addendum Note (Signed)
Addended by: Junious Dresser on: 05/13/2015 04:30 PM   Modules accepted: Orders

## 2015-05-13 NOTE — Addendum Note (Signed)
Addended by: Junious Dresser on: 05/13/2015 04:41 PM   Modules accepted: Orders

## 2015-05-13 NOTE — Progress Notes (Signed)
   Patient in nurse clinic for blood pressure check.  Patient denies chest pain, SOB, dizziness, headache, visual changes and numbness/tingling of extremities.  Derl Barrow, RN Today's Vitals   05/13/15 0922  BP: 132/84  Pulse: 63  SpO2: 94%

## 2015-05-21 ENCOUNTER — Other Ambulatory Visit: Payer: Self-pay | Admitting: Student

## 2015-05-21 DIAGNOSIS — I10 Essential (primary) hypertension: Secondary | ICD-10-CM

## 2015-05-24 ENCOUNTER — Ambulatory Visit
Admission: RE | Admit: 2015-05-24 | Discharge: 2015-05-24 | Disposition: A | Discharge status: Home / Self Care | Payer: BLUE CROSS/BLUE SHIELD | Source: Ambulatory Visit

## 2015-05-24 DIAGNOSIS — Z1231 Encounter for screening mammogram for malignant neoplasm of breast: Secondary | ICD-10-CM | POA: Diagnosis not present

## 2015-06-16 ENCOUNTER — Other Ambulatory Visit: Payer: Self-pay | Admitting: Student

## 2015-07-11 ENCOUNTER — Telehealth: Payer: Self-pay | Admitting: Student

## 2015-07-11 DIAGNOSIS — I10 Essential (primary) hypertension: Secondary | ICD-10-CM

## 2015-07-11 NOTE — Telephone Encounter (Signed)
Pt is calling because her insurance company will only pay for medications if they are in 90 day QTY. Can we call in new prescription to reflect this. She was also told by the doctor to stop taking Hydrochlorothiazide and she feels like she is having some swelling and thinks that since she stop this medication is why she is feeling this way. jw

## 2015-07-13 NOTE — Telephone Encounter (Signed)
Called and talked to patient in response to the message I received in my in basket about leg swelling after we stopped HCTZ. Patient denies leg swelling but reports knee pain. She denies fever. I have advised her to call front office and schedule follow up apt in one or two weeks for her hypertension. We will discuss about her knee pain at that time. She voiced understanding and agreed to make follow apt. After reviewing her medication, I realized that she has been taking her Toprol XL 100 mg twice a day for over two years. It is unclear why she was on twice a day dose. She was on Tartrate 100 mg twice a day at one point in the past.  I advised her to continue the same way until she sees me in the office.

## 2015-07-29 ENCOUNTER — Encounter: Payer: Self-pay | Admitting: Student

## 2015-07-29 ENCOUNTER — Ambulatory Visit (INDEPENDENT_AMBULATORY_CARE_PROVIDER_SITE_OTHER): Payer: BLUE CROSS/BLUE SHIELD | Admitting: Student

## 2015-07-29 VITALS — BP 118/53 | HR 75 | Temp 98.2°F | Ht 67.0 in | Wt 202.4 lb

## 2015-07-29 DIAGNOSIS — M170 Bilateral primary osteoarthritis of knee: Secondary | ICD-10-CM | POA: Diagnosis not present

## 2015-07-29 DIAGNOSIS — I10 Essential (primary) hypertension: Secondary | ICD-10-CM | POA: Diagnosis not present

## 2015-07-29 MED ORDER — NAPROXEN 500 MG PO TABS: 500.0000 mg | ORAL_TABLET | Freq: Two times a day (BID) | ORAL | Status: DC

## 2015-07-29 MED ORDER — METOPROLOL SUCCINATE ER 100 MG PO TB24: 100.0000 mg | ORAL_TABLET | Freq: Every day | ORAL | Status: DC

## 2015-07-29 NOTE — Patient Instructions (Signed)
It was great seeing you today! We have addressed the following issues today  1. Blood pressure: Your blood pressure is 118/53 today. Your goal blood pressure is later 140/90. We have cut down on your metoprolol to once daily. I will likely to come back and see [place] in about a  month for blood pressure checkup 2. Osteoarthritis: I have sent a prescription for naproxen to your pharmacy. Take as needed for pain with food.     If we did any lab work today, and the results require attention, either me or my nurse will get in touch with you. If everything is normal, you will get a letter in mail. If you don't hear from [place] in two weeks, please give [place] a call. Otherwise, I look forward to talking with you again at our next visit. If you have any questions or concerns before then, please call the clinic at [phone].  Please bring all your medications to every doctors visit   Sign up for My Chart to have easy access to your labs results, and communication with your Primary care physician.    Please check-out at the front desk before leaving the clinic.   Take Care,

## 2015-07-29 NOTE — Assessment & Plan Note (Signed)
Blood pressure at goal today (118/53). She was on metoprolol succinate twice daily for long time. We have cut that down to once daily. Renal function within normal. I will see her back in a month or earlier as needed

## 2015-07-29 NOTE — Progress Notes (Signed)
   Subjective:    Patient ID: Diane Clark, female    DOB: [birth year], 55 y.o.   MRN: [id number]  CC: Knee pain  HPI #Knee pain: Had this for a long time. Describes it as achy. Pain with standing from sitting position. Feels better once start walking. Denies swelling. Denies trauma or fall. Denies fever, unintentional weight loss & night time sweating. She also reports some achiness in her shoulders bilaterally.   #Blood pressure: Has been taking a metoprolol succinate 100 mg twice daily and lisinopril 40 mg once daily. We have discussed about her metoprolol dose over the phone about 2 weeks ago. It is unclear why she is on metoprolol succinate twice daily. She was on metoprolol tartrate twice daily at one point.   Review of Systems  Per history of present illness Objective:   Physical Exam Filed Vitals:   07/29/15 1709  BP: 118/53  Pulse: 75  Temp: 98.2 F (36.8 C)  TempSrc: Oral  Height: 5\' 7"  (1.702 m)  Weight: 202 lb 6.4 oz (91.808 kg)  SpO2: 100%    GEN: appears well, NAD CVS: RRR, normal s1 and s2, no murmurs, no edema RESP: no increased work of breathing, good air movement bilaterally, no crackles or wheeze GI: soft, non-tender,non-distended, +BS MSK: Knees appear symmetric, no swelling, no overlying skin color changes, no tenderness to palpation over the joint lines, no sign of effusion, full range of motion, mild crepitus bilaterally, valus and varus stress test negative bilaterally.  NEURO: A&O x3, no gross defecits  PSYCH: Pleasant, appropriate mood and affect     Assessment & Plan:  Arthritis, senescent History and exam suggestive for osteoarthritis. No red flags at this time.  I have discussed about exercise and weight loss with the patient. Also gave a prescription for naproxen 500 mg up to twice a day with food as needed for pain.  HYPERTENSION, BENIGN SYSTEMIC Blood pressure at goal today (118/53). She was on metoprolol succinate twice daily for long time.  We have cut that down to once daily. Renal function within normal. I will see her back in a month or earlier as needed

## 2015-07-29 NOTE — Assessment & Plan Note (Signed)
History and exam suggestive for osteoarthritis. No red flags at this time.  I have discussed about exercise and weight loss with the patient. Also gave a prescription for naproxen 500 mg up to twice a day with food as needed for pain.

## 2015-08-14 ENCOUNTER — Other Ambulatory Visit: Payer: Self-pay | Admitting: Student

## 2015-08-14 DIAGNOSIS — M170 Bilateral primary osteoarthritis of knee: Secondary | ICD-10-CM

## 2015-08-17 NOTE — Telephone Encounter (Signed)
Pt calling in to check on her refill for naproxen. Esmeralda Blanford Kennon Holter, CMA

## 2015-08-17 NOTE — Telephone Encounter (Signed)
3rd request. Anjoli Diemer L, RN  

## 2015-09-22 ENCOUNTER — Encounter: Payer: Self-pay | Admitting: Student

## 2015-09-22 ENCOUNTER — Ambulatory Visit (INDEPENDENT_AMBULATORY_CARE_PROVIDER_SITE_OTHER): Payer: BLUE CROSS/BLUE SHIELD | Admitting: Student

## 2015-09-22 DIAGNOSIS — Z23 Encounter for immunization: Secondary | ICD-10-CM | POA: Diagnosis not present

## 2015-09-22 DIAGNOSIS — I10 Essential (primary) hypertension: Secondary | ICD-10-CM | POA: Diagnosis not present

## 2015-09-22 DIAGNOSIS — Z0000 Encounter for general adult medical examination without abnormal findings: Secondary | ICD-10-CM | POA: Diagnosis not present

## 2015-09-22 MED ORDER — METOPROLOL SUCCINATE ER 50 MG PO TB24: 50.0000 mg | ORAL_TABLET | Freq: Every day | ORAL | 0 refills | Status: DC

## 2015-09-22 NOTE — Patient Instructions (Signed)
It was great seeing you today! We have addressed the following issues today  1. Blood pressure: Your blood pressure is 120/70 today. Your goal blood pressure is later 140/90. I am reducing her metoprolol to 50 mg daily. Please come back and see [place] for your blood pressure in about a month.2.     If we did any lab work today, and the results require attention, either me or my nurse will get in touch with you. If everything is normal, you will get a letter in mail. If you don't hear from [place] in two weeks, please give [place] a call. Otherwise, I look forward to talking with you again at our next visit. If you have any questions or concerns before then, please call the clinic at [phone].  Please bring all your medications to every doctors visit   Sign up for My Chart to have easy access to your labs results, and communication with your Primary care physician.    Please check-out at the front desk before leaving the clinic.   Take Care,

## 2015-09-22 NOTE — Assessment & Plan Note (Signed)
Received her flu shot today.  Stool card or colonoscopy in a year.

## 2015-09-22 NOTE — Assessment & Plan Note (Signed)
BP stable after reducing her metoprolol ER from 100 mg twice a day to once daily. Will wean her down to 50 mg daily with a goal to take her of this medication altogether. Continue lisinopril 40 daily. Follow up in one month.

## 2015-09-22 NOTE — Progress Notes (Signed)
   Subjective:    Patient ID: Diane Clark is a 55 y.o. old female.  HPI #hypertension: patient her for follow up on her blood pressure. She is on metoprolol ER 100 mg daily and lisinopril 40 mg daily. She reports good compliance with these medications. She was on metoprolol ER 100 mg twice a day when I saw her two months ago. We cut down to 100 mg daily with a goal to wean her off this as long as BP stays stable. She has no history of cardiac disease or HF.    PMH: reviewed SH: denies smoking  Review of Systems Per HPI Objective:   Vitals:   09/22/15 1522  BP: 120/70  Pulse: 74  Temp: 97.8 F (36.6 C)  TempSrc: Oral  Weight: 201 lb (91.2 kg)  Height: 5\' 7"  (1.702 m)    GEN: pleasant, appears well, no apparent distress. CVS: RRR, normal s1 and s2, no murmurs, no edema RESP: no increased work of breathing, good air movement bilaterally, no crackles or wheeze NEURO: alert and oriented appropriately, no gross defecits  PSYCH: appropriate mood and affect      Assessment & Plan:  HYPERTENSION, BENIGN SYSTEMIC BP stable after reducing her metoprolol ER from 100 mg twice a day to once daily. Will wean her down to 50 mg daily with a goal to take her of this medication altogether. Continue lisinopril 40 daily. Follow up in one month.  Routine health maintenance Received her flu shot today.  Stool card or colonoscopy in a year.

## 2015-10-15 ENCOUNTER — Other Ambulatory Visit: Payer: Self-pay | Admitting: Student

## 2015-10-15 DIAGNOSIS — I10 Essential (primary) hypertension: Secondary | ICD-10-CM

## 2015-11-15 ENCOUNTER — Ambulatory Visit (INDEPENDENT_AMBULATORY_CARE_PROVIDER_SITE_OTHER): Payer: BLUE CROSS/BLUE SHIELD | Admitting: Student

## 2015-11-15 ENCOUNTER — Encounter: Payer: Self-pay | Admitting: Student

## 2015-11-15 VITALS — BP 130/82 | HR 74 | Temp 98.6°F | Ht 67.0 in | Wt 199.0 lb

## 2015-11-15 DIAGNOSIS — M170 Bilateral primary osteoarthritis of knee: Secondary | ICD-10-CM | POA: Diagnosis not present

## 2015-11-15 DIAGNOSIS — I10 Essential (primary) hypertension: Secondary | ICD-10-CM | POA: Diagnosis not present

## 2015-11-15 LAB — BASIC METABOLIC PANEL WITH GFR
BUN: 9 mg/dL (ref 7–25)
CALCIUM: 9.7 mg/dL (ref 8.6–10.4)
CO2: 27 mmol/L (ref 20–31)
CREATININE: 0.57 mg/dL (ref 0.50–1.05)
Chloride: 101 mmol/L (ref 98–110)
GFR, Est African American: >89 mL/min (ref >=60)
GFR, Est Non African American: >89 mL/min (ref >=60)
GLUCOSE: 66 mg/dL (ref 65–99)
Potassium: 4.7 mmol/L (ref 3.5–5.3)
Sodium: 139 mmol/L (ref 135–146)

## 2015-11-15 MED ORDER — DULOXETINE HCL 30 MG PO CPEP: ORAL_CAPSULE | ORAL | 0 refills | Status: DC

## 2015-11-15 NOTE — Progress Notes (Signed)
   Subjective:    Patient ID: Diane Clark is a 55 y.o. old female.  Patient here for follow-up on hypertension. She also likes to discuss about her knee pain. HPI #Hypertension: controlled. She is on metoprolol ER 50 mg daily and lisinopril 40 mg daily. She reports good compliance with these medications. She was on metoprolol ER 100 mg twice a day when I saw her 3 months ago. We have slowly titrated her metoprolol down to 50 mg daily. Her blood pressure and heart rate have remained stable. She has no history of cardiac disease or HF.   #Bilateral knee pain: this is a chronic issue. Describes it as achy. Pain with standing from sitting position. Feels better once start walking. Denies swelling. Denies trauma or fall. Denies fever, unintentional weight loss & night time sweating. She also reports some achiness in her shoulders bilaterally. She has been using naproxen 500 mg daily as needed for pain. She is concerned about the effect of naproxen on a kidney. His sister history of  Kidney failure that led to transplant.   PMH: reviewed  SH: denies smoking cigarettes, drinking alcohol or recreational drug use.  Review of Systems Per HPI Objective:   Vitals:   11/15/15 1210  BP: 130/82  Pulse: 74  Temp: 98.6 F (37 C)  TempSrc: Oral  SpO2: 98%  Weight: 199 lb (90.3 kg)  Height: 5\' 7"  (1.702 m)    GEN: appears well, NAD CVS: RRR, normal s1 and s2, no murmurs, no edema RESP: no increased work of breathing, good air movement bilaterally, no crackles or wheeze GI: soft, non-tender,non-distended, +BS MSK: Knees appear symmetric, no swelling, no overlying skin color changes, no tenderness to palpation over the joint lines, no sign of effusion, full range of motion, mild crepitus bilaterally, valus and varus stress test negative bilaterally.  NEURO: A&O x3, no gross defecits  PSYCH: Pleasant, appropriate mood and affect but appears little bit anxious    Assessment & Plan:    HYPERTENSION, BENIGN SYSTEMIC Controlled. We will continue metoprolol ER 50 mg daily and lisinopril 40 mg daily -BMP today -Follow-up in 6 months  Osteoarthritis of knees, bilateral History and exam suggestive for osteoarthritis. She has no red flags except age. She is concerned about the effect of daily NSAID on her kidney. Discussed about tramadol or Cymbalta including the side effects. Patient works at [employer]. She doesn't want anything sedating. She has no history of bipolar disorder. We have agreed to start Cymbalta at 30 mg daily and increase it to 60 mg daily after 2 weeks.

## 2015-11-15 NOTE — Patient Instructions (Addendum)
It was great seeing you today! We have addressed the following issues today  1. Hypertension: Your blood pressure is 130/82 today. Your goal blood pressure is less than 140/90. Continue taking your medications. Follow-up in 6 months or as needed.  2. Knee/shoulder pain: this is likely osteoarthritis. I have sent a prescription for Cymbalta to the pharmacy. Take 1 capsule daily for 2 weeks, then increase to 2 capsules daily after that.    If we did any lab work today, and the results require attention, either me or my nurse will get in touch with you. If everything is normal, you will get a letter in mail. If you don't hear from [place] in two weeks, please give [place] a call. Otherwise, we look forward to seeing you again at your next visit. If you have any questions or concerns before then, please call the clinic at [phone].   Please bring all your medications to every doctors visit   Sign up for My Chart to have easy access to your labs results, and communication with your Primary care physician.     Please check-out at the front desk before leaving the clinic.    Take Care,

## 2015-11-16 ENCOUNTER — Encounter: Payer: Self-pay | Admitting: Student

## 2015-11-16 NOTE — Assessment & Plan Note (Signed)
History and exam suggestive for osteoarthritis. She has no red flags except age. She is concerned about the effect of daily NSAID on her kidney. Discussed about tramadol or Cymbalta including the side effects. Patient works at [employer]. She doesn't want anything sedating. She has no history of bipolar disorder. We have agreed to start Cymbalta at 30 mg daily and increase it to 60 mg daily after 2 weeks.

## 2015-11-16 NOTE — Progress Notes (Signed)
Results letter. BMP within normal limit.

## 2015-11-16 NOTE — Assessment & Plan Note (Signed)
Controlled. We will continue metoprolol ER 50 mg daily and lisinopril 40 mg daily -BMP today -Follow-up in 6 months

## 2015-12-13 ENCOUNTER — Other Ambulatory Visit: Payer: Self-pay | Admitting: Student

## 2015-12-13 DIAGNOSIS — M170 Bilateral primary osteoarthritis of knee: Secondary | ICD-10-CM

## 2016-01-11 ENCOUNTER — Other Ambulatory Visit: Payer: Self-pay | Admitting: Student

## 2016-01-11 DIAGNOSIS — M170 Bilateral primary osteoarthritis of knee: Secondary | ICD-10-CM

## 2016-01-17 ENCOUNTER — Other Ambulatory Visit: Payer: Self-pay | Admitting: Student

## 2016-01-17 DIAGNOSIS — I10 Essential (primary) hypertension: Secondary | ICD-10-CM

## 2016-04-19 ENCOUNTER — Other Ambulatory Visit: Payer: Self-pay | Admitting: Student

## 2016-04-19 DIAGNOSIS — I10 Essential (primary) hypertension: Secondary | ICD-10-CM

## 2016-05-07 ENCOUNTER — Other Ambulatory Visit: Payer: Self-pay | Admitting: Student

## 2016-05-07 DIAGNOSIS — M170 Bilateral primary osteoarthritis of knee: Secondary | ICD-10-CM

## 2016-05-16 ENCOUNTER — Other Ambulatory Visit: Payer: Self-pay | Admitting: Student

## 2016-05-16 DIAGNOSIS — I10 Essential (primary) hypertension: Secondary | ICD-10-CM

## 2016-06-26 ENCOUNTER — Other Ambulatory Visit: Payer: Self-pay | Admitting: *Deleted

## 2016-06-26 DIAGNOSIS — M170 Bilateral primary osteoarthritis of knee: Secondary | ICD-10-CM

## 2016-06-26 MED ORDER — DULOXETINE HCL 60 MG PO CPEP: 60.0000 mg | ORAL_CAPSULE | Freq: Every day | ORAL | 3 refills | Status: DC

## 2016-06-26 NOTE — Telephone Encounter (Signed)
Refill request for 90 day supply.  Martin, Tamika L, RN  

## 2016-06-29 ENCOUNTER — Other Ambulatory Visit: Payer: Self-pay | Admitting: Student

## 2016-06-29 DIAGNOSIS — M170 Bilateral primary osteoarthritis of knee: Secondary | ICD-10-CM

## 2016-06-29 MED ORDER — DULOXETINE HCL 60 MG PO CPEP: 60.0000 mg | ORAL_CAPSULE | Freq: Every day | ORAL | 3 refills | Status: DC

## 2016-06-29 NOTE — Telephone Encounter (Signed)
Pt now needs 90 day supply of her medications due to insurance reasons. Pt needs a 90 day supply of Cymbalta sent to [facility] on [address]. ep

## 2016-07-21 ENCOUNTER — Other Ambulatory Visit: Payer: Self-pay | Admitting: Student

## 2016-07-21 DIAGNOSIS — I10 Essential (primary) hypertension: Secondary | ICD-10-CM

## 2016-10-15 DIAGNOSIS — N1330 Unspecified hydronephrosis: Secondary | ICD-10-CM

## 2016-10-15 HISTORY — DX: Unspecified hydronephrosis: N13.30

## 2016-10-20 DIAGNOSIS — R1084 Generalized abdominal pain: Secondary | ICD-10-CM | POA: Diagnosis not present

## 2016-10-23 ENCOUNTER — Other Ambulatory Visit: Payer: Self-pay | Admitting: Student

## 2016-10-23 DIAGNOSIS — I10 Essential (primary) hypertension: Secondary | ICD-10-CM

## 2016-10-24 ENCOUNTER — Other Ambulatory Visit (HOSPITAL_COMMUNITY)
Admission: RE | Admit: 2016-10-24 | Discharge: 2016-10-24 | Disposition: A | Discharge status: Home / Self Care | Payer: BLUE CROSS/BLUE SHIELD | Source: Ambulatory Visit | Attending: Family Medicine | Admitting: Family Medicine

## 2016-10-24 ENCOUNTER — Ambulatory Visit (INDEPENDENT_AMBULATORY_CARE_PROVIDER_SITE_OTHER): Payer: BLUE CROSS/BLUE SHIELD | Admitting: Internal Medicine

## 2016-10-24 ENCOUNTER — Encounter: Payer: Self-pay | Admitting: Internal Medicine

## 2016-10-24 VITALS — BP 142/88 | HR 88 | Temp 98.4°F | Ht 67.0 in | Wt 192.6 lb

## 2016-10-24 DIAGNOSIS — R9389 Abnormal findings on diagnostic imaging of other specified body structures: Secondary | ICD-10-CM | POA: Diagnosis not present

## 2016-10-24 DIAGNOSIS — Z23 Encounter for immunization: Secondary | ICD-10-CM

## 2016-10-24 DIAGNOSIS — Z124 Encounter for screening for malignant neoplasm of cervix: Secondary | ICD-10-CM | POA: Diagnosis not present

## 2016-10-24 DIAGNOSIS — Z01419 Encounter for gynecological examination (general) (routine) without abnormal findings: Secondary | ICD-10-CM | POA: Diagnosis not present

## 2016-10-24 DIAGNOSIS — Z1211 Encounter for screening for malignant neoplasm of colon: Secondary | ICD-10-CM | POA: Diagnosis not present

## 2016-10-24 NOTE — Progress Notes (Signed)
[facility] Progress Note  Subjective:  Diane Clark is a 56 y.o. female with history of HTN, OA, and obesity who presents for follow-up of imaging finding from urgent care visit.  #Imaging findings: - Patient seen at [facility] 10/20/16 for abdominal cramping. KUB performed at that time showed the following per Radiologist's read (imaging not available):  "Considerage gas in loops of small bowel and colon but no evidence for obstruction. There is a large popcorn calcified mass in the midpelvis. The ossesus structures are unremarkable. Impression: No evidence for obstruction. Probable calcified uterine fibroid." - Patient has been in menopause since 2012. She denies any vaginal bleeding. - Abdominal pain has improved since starting miralax. - No family history cancer. ROS: No fevers, no n/v/d  Healthcare maintenance: Due for flu shot, pap smear, annual colon cancer screening  Social: Former smoker  No Known Allergies  Objective: Blood pressure (!) 142/88, pulse 88, temperature 98.4 F (36.9 C), temperature source Oral, height 5\' 7"  (1.702 m), weight 192 lb 9.6 oz (87.4 kg), last menstrual period 03/08/2010, SpO2 99 %. Body mass index is 30.17 kg/m. Constitutional: Obese female in NAD Abdominal: Soft. +BS, NT, ND GU: No lesions or discharge noted on speculum exam. No cervical motion tenderness on bimanual exam. Chaperone present.  Vitals reviewed  Assessment/Plan: Abnormal finding on imaging - Patient with incidental finding of "popcorn" calcified pelvic mass on KUB. No vaginal bleeding. No family history of cancer.  - Reassured patient that this likely represents a benign fibroid - Would pursue further imaging (transvaginal US) if having symptoms - Obtained pap smear, as was due   Health maintenance: Ordered stool cards for colon cancer screening. Flu shot administered.   Follow-up prn.  Olene Floss, MD [place], PGY-3

## 2016-10-24 NOTE — Patient Instructions (Signed)
Ms. Busk,  You likely have a benign fibroid. If you have vaginal bleeding or continued abdominal pain, please see [place] back.  I will call with pap smear results.  Best, Dr. Ola Spurr

## 2016-10-26 DIAGNOSIS — R9389 Abnormal findings on diagnostic imaging of other specified body structures: Secondary | ICD-10-CM | POA: Insufficient documentation

## 2016-10-26 LAB — CYTOLOGY - PAP
DIAGNOSIS: NEGATIVE
HPV (WINDOPATH): NOT DETECTED

## 2016-10-26 NOTE — Assessment & Plan Note (Signed)
-   Patient with incidental finding of "popcorn" calcified pelvic mass on KUB. No vaginal bleeding. No family history of cancer.  - Reassured patient that this likely represents a benign fibroid - Would pursue further imaging (transvaginal US) if having symptoms - Obtained pap smear, as was due

## 2016-10-29 ENCOUNTER — Telehealth: Payer: Self-pay | Admitting: *Deleted

## 2016-10-29 NOTE — Telephone Encounter (Signed)
-----   Message from [facility], MD sent at 10/26/2016  5:31 PM EDT ----- Please let patient know her recent Pap smear was normal. Thank you.

## 2016-10-29 NOTE — Telephone Encounter (Signed)
Pt informed. Jermel Artley, CMA  

## 2016-11-02 ENCOUNTER — Telehealth: Payer: Self-pay

## 2016-11-02 NOTE — Telephone Encounter (Signed)
Pt calling for referral to [facility]. Please call pt [phone] when referral has been placed. Diane Clark, CMA

## 2016-11-05 ENCOUNTER — Encounter (HOSPITAL_COMMUNITY): Payer: Self-pay

## 2016-11-05 ENCOUNTER — Emergency Department (HOSPITAL_COMMUNITY): Payer: BLUE CROSS/BLUE SHIELD

## 2016-11-05 ENCOUNTER — Emergency Department (HOSPITAL_COMMUNITY)
Admission: EM | Admit: 2016-11-05 | Discharge: 2016-11-05 | Disposition: A | Discharge status: Home / Self Care | Payer: BLUE CROSS/BLUE SHIELD | Attending: Physician Assistant | Admitting: Physician Assistant

## 2016-11-05 DIAGNOSIS — Z79899 Other long term (current) drug therapy: Secondary | ICD-10-CM | POA: Insufficient documentation

## 2016-11-05 DIAGNOSIS — N179 Acute kidney failure, unspecified: Secondary | ICD-10-CM | POA: Diagnosis not present

## 2016-11-05 DIAGNOSIS — I10 Essential (primary) hypertension: Secondary | ICD-10-CM | POA: Diagnosis not present

## 2016-11-05 DIAGNOSIS — N202 Calculus of kidney with calculus of ureter: Secondary | ICD-10-CM | POA: Diagnosis not present

## 2016-11-05 DIAGNOSIS — N135 Crossing vessel and stricture of ureter without hydronephrosis: Secondary | ICD-10-CM | POA: Diagnosis not present

## 2016-11-05 DIAGNOSIS — Z87891 Personal history of nicotine dependence: Secondary | ICD-10-CM | POA: Diagnosis not present

## 2016-11-05 DIAGNOSIS — N1330 Unspecified hydronephrosis: Secondary | ICD-10-CM | POA: Diagnosis not present

## 2016-11-05 DIAGNOSIS — R109 Unspecified abdominal pain: Secondary | ICD-10-CM | POA: Diagnosis not present

## 2016-11-05 HISTORY — DX: Essential (primary) hypertension: I10

## 2016-11-05 LAB — CBC
HEMATOCRIT: 35.4 % — AB (ref 36.0–46.0)
HEMOGLOBIN: 11.3 g/dL — AB (ref 12.0–15.0)
MCH: 25.6 pg — AB (ref 26.0–34.0)
MCHC: 31.9 g/dL (ref 30.0–36.0)
MCV: 80.3 fL (ref 78.0–100.0)
Platelets: 329 10*3/uL (ref 150–400)
RBC: 4.41 MIL/uL (ref 3.87–5.11)
RDW: 15.2 % (ref 11.5–15.5)
WBC: 7.7 10*3/uL (ref 4.0–10.5)

## 2016-11-05 LAB — URINALYSIS, ROUTINE W REFLEX MICROSCOPIC
BILIRUBIN URINE: NEGATIVE
Glucose, UA: NEGATIVE mg/dL
Ketones, ur: NEGATIVE mg/dL
Nitrite: NEGATIVE
PROTEIN: NEGATIVE mg/dL
Specific Gravity, Urine: 1.01 (ref 1.005–1.030)
pH: 7 (ref 5.0–8.0)

## 2016-11-05 LAB — COMPREHENSIVE METABOLIC PANEL
ALBUMIN: 3.2 g/dL — AB (ref 3.5–5.0)
ALK PHOS: 82 U/L (ref 38–126)
ALT: 15 U/L (ref 14–54)
ANION GAP: 11 (ref 5–15)
AST: 18 U/L (ref 15–41)
BUN: 9 mg/dL (ref 6–20)
CALCIUM: 9.5 mg/dL (ref 8.9–10.3)
CHLORIDE: 99 mmol/L — AB (ref 101–111)
CO2: 26 mmol/L (ref 22–32)
Creatinine, Ser: 1.93 mg/dL — ABNORMAL HIGH (ref 0.44–1.00)
GFR calc non Af Amer: 27 mL/min — ABNORMAL LOW (ref >60)
GFR, EST AFRICAN AMERICAN: 32 mL/min — AB (ref >60)
GLUCOSE: 115 mg/dL — AB (ref 65–99)
POTASSIUM: 3.6 mmol/L (ref 3.5–5.1)
SODIUM: 136 mmol/L (ref 135–145)
Total Bilirubin: 0.4 mg/dL (ref 0.3–1.2)
Total Protein: 8.2 g/dL — ABNORMAL HIGH (ref 6.5–8.1)

## 2016-11-05 LAB — LIPASE, BLOOD: LIPASE: 17 U/L (ref 11–51)

## 2016-11-05 MED ORDER — IOPAMIDOL (ISOVUE-300) INJECTION 61%
INTRAVENOUS | Status: AC
  Filled 2016-11-05: qty 30

## 2016-11-05 MED ORDER — CEPHALEXIN 500 MG PO CAPS: 500.0000 mg | ORAL_CAPSULE | Freq: Two times a day (BID) | ORAL | 0 refills | Status: DC

## 2016-11-05 MED ORDER — CEPHALEXIN 250 MG PO CAPS
500.0000 mg | ORAL_CAPSULE | Freq: Once | ORAL | Status: AC
  Administered 2016-11-05: 500 mg via ORAL
  Filled 2016-11-05: qty 2

## 2016-11-05 NOTE — ED Triage Notes (Signed)
Pt states she has had some abdominal pain with nausea X1 week. She reports one episode of vomiting last week. She also states she has felt flushed, afebrile in triage.

## 2016-11-05 NOTE — ED Notes (Signed)
ED Provider at bedside. 

## 2016-11-05 NOTE — ED Notes (Signed)
Patient transported to CT 

## 2016-11-05 NOTE — Discharge Instructions (Signed)
Please read the attached information.  Please contact urology specialist office tomorrow to schedule outpatient follow-up.  Please take antibiotics as directed.  If you develop any new or worsening signs or symptoms return immediately to [facility] emergency room for further evaluation and management.

## 2016-11-05 NOTE — ED Provider Notes (Signed)
[facility] DEPART[facility] Provider Note   CSN: [id number] Arrival date & time: 11/05/16  1014     History   Chief Complaint Chief Complaint  Patient presents with  . Nausea    HPI Diane Clark is a 56 y.o. female.  HPI   56 year old female presents today with complaints of abdominal pain and distention.  Patient reports approximately 1 month ago she felt bloated.  She notes she was unable to pass gas while laying in bed, only while sitting on the toilet.  She notes some associated nausea, and one episode of vomiting last week.  She notes since her symptoms she has had a decreased appetite but is able to eat.  She notes that her bowel movements had not been normal and slightly less frequent.  She was seen in urgent care with a plain film of her abdomen showing distended bowel with no acute obstruction.  She was started on MiraLAX at that time.  Patient notes symptoms have not worsened but continued to persist.  She notes minor pain in the bilateral lower abdomen, and distention throughout.  Patient denies any blood in her stool, denies any vaginal bleeding or discharge.  Patient notes she feels hot, but does not have a fever.  She denies any change in her diet prior to onset of symptoms, denies any history of abdominal surgery, reports a smoking history over 40 years ago.  She reports using Tylenol over the last several days, none today.  Patient denies any urinary symptoms.    Past Medical History:  Diagnosis Date  . Hypertension     Patient Active Problem List   Diagnosis Date Noted  . Abnormal finding on imaging 10/26/2016  . Osteoarthritis of knees, bilateral 07/29/2015  . Routine health maintenance 04/19/2015  . Physical exam, routine 03/21/2010  . OBESITY, NOS 03/14/2006  . HYPERTENSION, BENIGN SYSTEMIC 03/14/2006    History reviewed. No pertinent surgical history.  OB History    No data available       Home Medications    Prior to  Admission medications   Medication Sig Start Date End Date Taking? Authorizing Provider  DULoxetine (CYMBALTA) 60 MG capsule Take 1 capsule (60 mg total) by mouth daily. 06/29/16  Yes Gonfa, Charlesetta Ivory, MD  lisinopril (PRINIVIL,ZESTRIL) 40 MG tablet TAKE 1 TABLET BY MOUTH EVERY DAY 05/17/16  Yes Gonfa, Taye T, MD  metoprolol succinate (TOPROL-XL) 50 MG 24 hr tablet TAKE 1 TABLET EVERY DAY 10/23/16  Yes Gonfa, Charlesetta Ivory, MD  cephALEXin (KEFLEX) 500 MG capsule Take 1 capsule (500 mg total) by mouth 2 (two) times daily. 11/05/16   Demitris Pokorny, Dellis Filbert, PA-C  naproxen (NAPROSYN) 500 MG tablet TAKE 1 TABLET BY MOUTH TWICE A DAY WITH A MEAL AS NEEDED FOR PAIN Patient not taking: Reported on 11/05/2016 08/17/15   Mercy Riding, MD    Family History History reviewed. No pertinent family history.  Social History Social History  Substance Use Topics  . Smoking status: Former Research scientist (life sciences)  . Smokeless tobacco: Never Used  . Alcohol use Yes     Comment: occ     Allergies   Patient has no known allergies.   Review of Systems Review of Systems  All other systems reviewed and are negative.    Physical Exam Updated Vital Signs BP (!) 166/86   Pulse 79   Temp 98.8 F (37.1 C) (Oral)   Resp 16   LMP 03/08/2010   SpO2 99%   Physical  Exam  Constitutional: She is oriented to person, place, and time. She appears well-developed and well-nourished.  HENT:  Head: Normocephalic and atraumatic.  Eyes: Pupils are equal, round, and reactive to light. Conjunctivae are normal. Right eye exhibits no discharge. Left eye exhibits no discharge. No scleral icterus.  Neck: Normal range of motion. No JVD present. No tracheal deviation present.  Pulmonary/Chest: Effort normal. No stridor.  Abdominal: Soft. Bowel sounds are normal. She exhibits no distension and no mass. There is tenderness. There is no rebound and no guarding. No hernia.  Minor tenderness to palpation of the bilateral lower abdomen and pelvis-remainder of  abdominal exam normal  Neurological: She is alert and oriented to person, place, and time. Coordination normal.  Psychiatric: She has a normal mood and affect. Her behavior is normal. Judgment and thought content normal.  Nursing note and vitals reviewed.    ED Treatments / Results  Labs (all labs ordered are listed, but only abnormal results are displayed) Labs Reviewed  COMPREHENSIVE METABOLIC PANEL - Abnormal; Notable for the following:       Result Value   Chloride 99 (*)    Glucose, Bld 115 (*)    Creatinine, Ser 1.93 (*)    Total Protein 8.2 (*)    Albumin 3.2 (*)    GFR calc non Af Amer 27 (*)    GFR calc Af Amer 32 (*)    All other components within normal limits  CBC - Abnormal; Notable for the following:    Hemoglobin 11.3 (*)    HCT 35.4 (*)    MCH 25.6 (*)    All other components within normal limits  URINALYSIS, ROUTINE W REFLEX MICROSCOPIC - Abnormal; Notable for the following:    Hgb urine dipstick SMALL (*)    Leukocytes, UA LARGE (*)    Bacteria, UA FEW (*)    Squamous Epithelial / LPF 0-5 (*)    All other components within normal limits  URINE CULTURE  LIPASE, BLOOD    EKG  EKG Interpretation None       Radiology Ct Abdomen Pelvis Wo Contrast  Result Date: 11/05/2016 CLINICAL DATA:  Lower abdominal pain with nausea for several months. EXAM: CT ABDOMEN AND PELVIS WITHOUT CONTRAST TECHNIQUE: Multidetector CT imaging of the abdomen and pelvis was performed following the standard protocol without IV contrast. COMPARISON:  None. FINDINGS: Lower chest: Focal bulbous appearance of the descending thoracic aorta may be from crossing vein. Attention on follow-up. No acute finding Hepatobiliary: No focal liver abnormality.No evidence of biliary obstruction or stone. Pancreas: Unremarkable. Spleen: Unremarkable. Adrenals/Urinary Tract: Negative adrenals. Bilateral moderate to advance hydronephrosis and upper hydroureter. The ureters are medially displaced into  retroperitoneal soft tissue density. No calculi or renal cortical mass is seen. Unremarkable bladder. Stomach/Bowel: Lax ileo colic mesentery with high cecum. No obstructive or inflammatory process is noted. Vascular/Lymphatic: There is indistinct soft tissue density obscuring the aorta and upper iliacs. Soft tissue density effaces the fat plane between the IVC and aorta. No overt malignant features such is bone erosion or discrete mass/adenopathy, but postcontrast imaging is needed. No detectable aortic aneurysm. No gas within the soft tissue density. Reproductive:Stippled calcification within the deep pelvic mass measuring 5.7 cm, likely a pedunculated fibroid based on location and appearance. Negative adnexae. Other: No ascites or pneumoperitoneum. Musculoskeletal: L4-5 facet arthropathy at L5-S1 advanced disc degeneration. No bony erosion. These results were called by telephone at the time of interpretation on 11/05/2016 at 3:31 pm to Dr. Okey Regal ,  who verbally acknowledged these results. IMPRESSION: 1. Findings of retroperitoneal fibrosis causing bilateral hydroureteronephrosis. Recommend postcontrast CT when renal function permits. 2. Apparent focal widening of the descending aorta may be a crossing vein. Attention on postcontrast study. 3. 5 cm pedunculated fibroid. Electronically Signed   By: Monte Fantasia M.D.   On: 11/05/2016 15:29    Procedures Procedures (including critical care time)  Medications Ordered in ED Medications  iopamidol (ISOVUE-300) 61 % injection (not administered)  cephALEXin (KEFLEX) capsule 500 mg (500 mg Oral Given 11/05/16 1653)     Initial Impression / Assessment and Plan / ED Course  I have reviewed the triage vital signs and the nursing notes.  Pertinent labs & imaging results that were available during my care of the patient were reviewed by me and considered in my medical decision making (see chart for details).     Final Clinical Impressions(s) / ED  Diagnoses   Final diagnoses:  Retroperitoneal fibrosis  Hydronephrosis, unspecified hydronephrosis type  AKI (acute kidney injury) ([place])    Labs: Urinalysis, lipase, CMP  Imaging: CT abdomen pelvis with contrast  Consults: Dr. Lovena Neighbours  Therapeutics:   Discharge Meds: Keflex  Assessment/Plan: 56 year old female presents today with complaints of abdominal pain.  Patient's workup is significant for retroperitoneal fibrosis.  Patient was noted to have elevated creatinine and hydronephrosis secondary to the fibrosis.  Patient care was discussed with Dr. Gilford Rile of urology who recommended the patient follow-up as an outpatient with likely stenting and to be started on kelfex given leukocyte positive urine.  Patient does have very few bacteria and leukocytes in her urine, she will be started on antibiotics at this time.  She is afebrile with no signs of systemic illness, no true urinary complaints.  Patient will follow-up immediately in the emergency room if she develops any new or worsening signs or symptoms, she will follow-up tomorrow with Dr. Jackson Latino office for scheduling her evaluation.  Was given strict return precautions.  Patient had no further questions or concerns at the time of discharge.      New Prescriptions Discharge Medication List as of 11/05/2016  4:46 PM    START taking these medications   Details  cephALEXin (KEFLEX) 500 MG capsule Take 1 capsule (500 mg total) by mouth 2 (two) times daily., Starting Mon 11/05/2016, Print         Georgine Wiltse, [place], PA-C 11/05/16 1852    Macarthur Critchley, MD 11/06/16 [id number]

## 2016-11-06 LAB — URINE CULTURE: Culture: <10000 — AB

## 2016-11-06 NOTE — Telephone Encounter (Signed)
Attempted to call patient to clarify the indication for referral to endocrinology. Patient did not pickup the phone. I am wondering if someone kindly try calling you again to clarify why she needed a referral to endocrinology. Diane Clark

## 2016-11-09 ENCOUNTER — Encounter (HOSPITAL_COMMUNITY): Payer: Self-pay | Admitting: *Deleted

## 2016-11-09 ENCOUNTER — Encounter (HOSPITAL_COMMUNITY)
Admission: AD | Disposition: A | Discharge status: Home / Self Care | Payer: Self-pay | Source: Ambulatory Visit | Attending: Urology

## 2016-11-09 ENCOUNTER — Ambulatory Visit (HOSPITAL_COMMUNITY): Payer: BLUE CROSS/BLUE SHIELD | Admitting: Certified Registered Nurse Anesthetist

## 2016-11-09 ENCOUNTER — Observation Stay (HOSPITAL_COMMUNITY)
Admission: AD | Admit: 2016-11-09 | Discharge: 2016-11-10 | Disposition: A | Discharge status: Home / Self Care | Payer: BLUE CROSS/BLUE SHIELD | Source: Ambulatory Visit | Attending: Urology | Admitting: Urology

## 2016-11-09 ENCOUNTER — Ambulatory Visit (HOSPITAL_COMMUNITY): Payer: BLUE CROSS/BLUE SHIELD

## 2016-11-09 ENCOUNTER — Other Ambulatory Visit: Payer: Self-pay | Admitting: Urology

## 2016-11-09 DIAGNOSIS — I10 Essential (primary) hypertension: Secondary | ICD-10-CM | POA: Diagnosis not present

## 2016-11-09 DIAGNOSIS — N135 Crossing vessel and stricture of ureter without hydronephrosis: Secondary | ICD-10-CM | POA: Diagnosis present

## 2016-11-09 DIAGNOSIS — N132 Hydronephrosis with renal and ureteral calculous obstruction: Secondary | ICD-10-CM | POA: Diagnosis not present

## 2016-11-09 DIAGNOSIS — Z87891 Personal history of nicotine dependence: Secondary | ICD-10-CM | POA: Insufficient documentation

## 2016-11-09 DIAGNOSIS — N3592 Unspecified urethral stricture, female: Secondary | ICD-10-CM | POA: Insufficient documentation

## 2016-11-09 DIAGNOSIS — Z791 Long term (current) use of non-steroidal anti-inflammatories (NSAID): Secondary | ICD-10-CM | POA: Insufficient documentation

## 2016-11-09 DIAGNOSIS — M1990 Unspecified osteoarthritis, unspecified site: Secondary | ICD-10-CM | POA: Diagnosis not present

## 2016-11-09 DIAGNOSIS — Z79899 Other long term (current) drug therapy: Secondary | ICD-10-CM | POA: Insufficient documentation

## 2016-11-09 DIAGNOSIS — N131 Hydronephrosis with ureteral stricture, not elsewhere classified: Secondary | ICD-10-CM | POA: Diagnosis not present

## 2016-11-09 DIAGNOSIS — N35919 Unspecified urethral stricture, male, unspecified site: Secondary | ICD-10-CM | POA: Diagnosis not present

## 2016-11-09 DIAGNOSIS — Z466 Encounter for fitting and adjustment of urinary device: Secondary | ICD-10-CM | POA: Diagnosis not present

## 2016-11-09 DIAGNOSIS — N201 Calculus of ureter: Secondary | ICD-10-CM | POA: Diagnosis not present

## 2016-11-09 HISTORY — DX: Unspecified convulsions: R56.9

## 2016-11-09 HISTORY — PX: CYSTOSCOPY WITH RETROGRADE PYELOGRAM, URETEROSCOPY AND STENT PLACEMENT: SHX5789

## 2016-11-09 SURGERY — CYSTOURETEROSCOPY, WITH RETROGRADE PYELOGRAM AND STENT INSERTION
Anesthesia: General | Site: Bladder | Laterality: Bilateral

## 2016-11-09 MED ORDER — ATROPINE SULFATE 1 MG/10ML IJ SOSY
PREFILLED_SYRINGE | INTRAMUSCULAR | Status: AC
  Filled 2016-11-09: qty 10

## 2016-11-09 MED ORDER — PHENYLEPHRINE 40 MCG/ML (10ML) SYRINGE FOR IV PUSH (FOR BLOOD PRESSURE SUPPORT)
PREFILLED_SYRINGE | INTRAVENOUS | Status: DC | PRN
Start: 2016-11-09 — End: 2016-11-09
  Administered 2016-11-09: 80 ug via INTRAVENOUS

## 2016-11-09 MED ORDER — OXYBUTYNIN CHLORIDE 5 MG PO TABS: 5.0000 mg | ORAL_TABLET | Freq: Three times a day (TID) | ORAL | Status: DC | PRN

## 2016-11-09 MED ORDER — MIDAZOLAM HCL 2 MG/2ML IJ SOLN
INTRAMUSCULAR | Status: AC
  Filled 2016-11-09: qty 2

## 2016-11-09 MED ORDER — CEFAZOLIN SODIUM-DEXTROSE 2-4 GM/100ML-% IV SOLN
2.0000 g | Freq: Three times a day (TID) | INTRAVENOUS | Status: AC
  Administered 2016-11-10: 2 g via INTRAVENOUS
  Filled 2016-11-09: qty 100

## 2016-11-09 MED ORDER — PROMETHAZINE HCL 25 MG/ML IJ SOLN: 6.2500 mg | INTRAMUSCULAR | Status: DC | PRN

## 2016-11-09 MED ORDER — ROCURONIUM BROMIDE 50 MG/5ML IV SOSY
PREFILLED_SYRINGE | INTRAVENOUS | Status: AC
  Filled 2016-11-09: qty 5

## 2016-11-09 MED ORDER — HYDROMORPHONE HCL 1 MG/ML IJ SOLN: 0.5000 mg | INTRAMUSCULAR | Status: DC | PRN

## 2016-11-09 MED ORDER — ONDANSETRON HCL 4 MG/2ML IJ SOLN
INTRAMUSCULAR | Status: AC
  Filled 2016-11-09: qty 2

## 2016-11-09 MED ORDER — SODIUM CHLORIDE 0.9 % IR SOLN
Status: DC | PRN
  Administered 2016-11-09 (×2): 1000 mL
  Administered 2016-11-09: 3000 mL

## 2016-11-09 MED ORDER — FENTANYL CITRATE (PF) 100 MCG/2ML IJ SOLN: 25.0000 ug | INTRAMUSCULAR | Status: DC | PRN

## 2016-11-09 MED ORDER — SUCCINYLCHOLINE CHLORIDE 200 MG/10ML IV SOSY
PREFILLED_SYRINGE | INTRAVENOUS | Status: AC
  Filled 2016-11-09: qty 10

## 2016-11-09 MED ORDER — CEFAZOLIN SODIUM-DEXTROSE 2-4 GM/100ML-% IV SOLN
2.0000 g | Freq: Once | INTRAVENOUS | Status: AC
  Administered 2016-11-09: 2 g via INTRAVENOUS
  Filled 2016-11-09: qty 100

## 2016-11-09 MED ORDER — PHENAZOPYRIDINE HCL 200 MG PO TABS
200.0000 mg | ORAL_TABLET | Freq: Three times a day (TID) | ORAL | Status: DC
  Administered 2016-11-10: 200 mg via ORAL
  Filled 2016-11-09 (×2): qty 1

## 2016-11-09 MED ORDER — DEXAMETHASONE SODIUM PHOSPHATE 4 MG/ML IJ SOLN
INTRAMUSCULAR | Status: DC | PRN
Start: 2016-11-09 — End: 2016-11-09

## 2016-11-09 MED ORDER — IOHEXOL 300 MG/ML  SOLN
INTRAMUSCULAR | Status: DC | PRN
  Administered 2016-11-09: 30 mL via INTRAVENOUS

## 2016-11-09 MED ORDER — SODIUM CHLORIDE 0.9 % IV SOLN
INTRAVENOUS | Status: DC
  Administered 2016-11-09: 21:00:00 via INTRAVENOUS

## 2016-11-09 MED ORDER — FENTANYL CITRATE (PF) 100 MCG/2ML IJ SOLN
INTRAMUSCULAR | Status: DC | PRN
  Administered 2016-11-09 (×2): 50 ug via INTRAVENOUS

## 2016-11-09 MED ORDER — DEXAMETHASONE SODIUM PHOSPHATE 10 MG/ML IJ SOLN
INTRAMUSCULAR | Status: AC
  Filled 2016-11-09: qty 1

## 2016-11-09 MED ORDER — ONDANSETRON 4 MG PO TBDP: 4.0000 mg | ORAL_TABLET | Freq: Four times a day (QID) | ORAL | Status: DC | PRN

## 2016-11-09 MED ORDER — ACETAMINOPHEN 325 MG PO TABS: 650.0000 mg | ORAL_TABLET | Freq: Four times a day (QID) | ORAL | Status: DC | PRN

## 2016-11-09 MED ORDER — EPHEDRINE 5 MG/ML INJ
INTRAVENOUS | Status: AC
  Filled 2016-11-09: qty 10

## 2016-11-09 MED ORDER — LACTATED RINGERS IV SOLN: INTRAVENOUS | Status: DC | PRN

## 2016-11-09 MED ORDER — SUCCINYLCHOLINE CHLORIDE 200 MG/10ML IV SOSY
PREFILLED_SYRINGE | INTRAVENOUS | Status: DC | PRN
  Administered 2016-11-09: 100 mg via INTRAVENOUS

## 2016-11-09 MED ORDER — LIDOCAINE 2% (20 MG/ML) 5 ML SYRINGE
INTRAMUSCULAR | Status: AC
  Filled 2016-11-09: qty 5

## 2016-11-09 MED ORDER — ACETAMINOPHEN 650 MG RE SUPP: 650.0000 mg | Freq: Four times a day (QID) | RECTAL | Status: DC | PRN

## 2016-11-09 MED ORDER — ONDANSETRON HCL 4 MG/2ML IJ SOLN: 4.0000 mg | Freq: Four times a day (QID) | INTRAMUSCULAR | Status: DC | PRN

## 2016-11-09 MED ORDER — PROPOFOL 10 MG/ML IV BOLUS
INTRAVENOUS | Status: DC | PRN
  Administered 2016-11-09: 150 mg via INTRAVENOUS

## 2016-11-09 MED ORDER — PHENYLEPHRINE 40 MCG/ML (10ML) SYRINGE FOR IV PUSH (FOR BLOOD PRESSURE SUPPORT)
PREFILLED_SYRINGE | INTRAVENOUS | Status: AC
  Filled 2016-11-09: qty 20

## 2016-11-09 MED ORDER — LIDOCAINE 2% (20 MG/ML) 5 ML SYRINGE
INTRAMUSCULAR | Status: DC | PRN
Start: 2016-11-09 — End: 2016-11-09
  Administered 2016-11-09: 100 mg via INTRAVENOUS

## 2016-11-09 MED ORDER — HYDROCODONE-ACETAMINOPHEN 5-325 MG PO TABS: 1.0000 | ORAL_TABLET | ORAL | Status: DC | PRN

## 2016-11-09 MED ORDER — LACTATED RINGERS IV SOLN
INTRAVENOUS | Status: DC
  Administered 2016-11-09 (×3): via INTRAVENOUS

## 2016-11-09 MED ORDER — DEXAMETHASONE SODIUM PHOSPHATE 10 MG/ML IJ SOLN
INTRAMUSCULAR | Status: DC | PRN
  Administered 2016-11-09: 10 mg via INTRAVENOUS

## 2016-11-09 MED ORDER — FENTANYL CITRATE (PF) 100 MCG/2ML IJ SOLN
INTRAMUSCULAR | Status: AC
  Filled 2016-11-09: qty 2

## 2016-11-09 MED ORDER — SCOPOLAMINE 1 MG/3DAYS TD PT72
MEDICATED_PATCH | TRANSDERMAL | Status: AC
  Filled 2016-11-09: qty 1

## 2016-11-09 MED ORDER — ONDANSETRON HCL 4 MG/2ML IJ SOLN
INTRAMUSCULAR | Status: AC
Start: 2016-11-09 — End: ?
  Filled 2016-11-09: qty 2

## 2016-11-09 MED ORDER — PROPOFOL 10 MG/ML IV BOLUS
INTRAVENOUS | Status: AC
  Filled 2016-11-09: qty 20

## 2016-11-09 MED ORDER — MIDAZOLAM HCL 2 MG/2ML IJ SOLN
INTRAMUSCULAR | Status: DC | PRN
  Administered 2016-11-09: 1 mg via INTRAVENOUS
  Administered 2016-11-09: 2 mg via INTRAVENOUS

## 2016-11-09 MED ORDER — SCOPOLAMINE 1 MG/3DAYS TD PT72
1.0000 | MEDICATED_PATCH | Freq: Once | TRANSDERMAL | Status: AC
  Administered 2016-11-09: 1 via TRANSDERMAL

## 2016-11-09 SURGICAL SUPPLY — 21 items
BAG URO CATCHER STRL LF (MISCELLANEOUS) ×3 IMPLANT
BASKET ZERO TIP NITINOL 2.4FR (BASKET) ×1 IMPLANT
BSKT STON RTRVL ZERO TP 2.4FR (BASKET)
CATH INTERMIT  6FR 70CM (CATHETERS) ×3 IMPLANT
CLOTH BEACON ORANGE TIMEOUT ST (SAFETY) ×3 IMPLANT
COVER FOOTSWITCH UNIV (MISCELLANEOUS) ×3 IMPLANT
DRAPE C-ARM 42X120 X-RAY (DRAPES) ×3 IMPLANT
EXTRACTOR STONE NITINOL NGAGE (UROLOGICAL SUPPLIES) IMPLANT
FIBER LASER FLEXIVA 365 (UROLOGICAL SUPPLIES) ×1 IMPLANT
FIBER LASER TRAC TIP (UROLOGICAL SUPPLIES) ×1 IMPLANT
GLOVE BIOGEL M STRL SZ7.5 (GLOVE) ×3 IMPLANT
GOWN STRL REUS W/TWL LRG LVL3 (GOWN DISPOSABLE) ×3 IMPLANT
GOWN STRL REUS W/TWL XL LVL3 (GOWN DISPOSABLE) ×3 IMPLANT
GUIDEWIRE ANG ZIPWIRE 038X150 (WIRE) IMPLANT
GUIDEWIRE STR DUAL SENSOR (WIRE) ×3 IMPLANT
MANIFOLD NEPTUNE II (INSTRUMENTS) ×3 IMPLANT
PACK CYSTO (CUSTOM PROCEDURE TRAY) ×3 IMPLANT
SHEATH ACCESS URETERAL 38CM (SHEATH) ×1 IMPLANT
STENT URET 6FRX26 CONTOUR (STENTS) ×4 IMPLANT
TUBING CONNECTING 10 (TUBING) ×2 IMPLANT
TUBING CONNECTING 10' (TUBING) ×1

## 2016-11-09 NOTE — H&P (Signed)
Urology Preoperative H&P   Chief Complaint: Nausea and flank pain History of Present Illness: Diane Clark is a 56 y.o. female who was seen in the ED on 11/05/16 for back pain and abdominal bloating for the past month. CT abd/pel without that day demonstrated bilateral hydronephrosis and findings concerning for possible retroperitoneal fibrosis.   Today, the patient states that her abdominal/flank pain are improved, but she has intractable nausea/vomiting and has not been able to tolerate fluids for the last 48 hours. She states that she's only been able to void small amounts of the last several days, but denies dysuria or hematuria. No prior history of nephrolithiasis or radiation, but does have a history of arthritis for which she is taking Cymbalta.  Past Medical History:  Diagnosis Date  . Arthritis    knee and shoulders. Takes cymbalta  . Hypertension   . Seizures ([place])    with fever. Last one age 76    Past Surgical History:  Procedure Laterality Date  . CERVICAL CONE BIOPSY  1990    Allergies: No Known Allergies  History reviewed. No pertinent family history.  Social History:  reports that she has quit smoking. She has never used smokeless tobacco. She reports that she drinks alcohol. She reports that she does not use drugs.  ROS: A complete review of systems was performed.  All systems are negative except for pertinent findings as noted.  Physical Exam:  Vital signs in last 24 hours: Temp:  [98.5 F (36.9 C)] 98.5 F (36.9 C) (10/26 1533) Pulse Rate:  [84] 84 (10/26 1533) Resp:  [16] 16 (10/26 1533) BP: (159)/(85) 159/85 (10/26 1533) SpO2:  [100 %] 100 % (10/26 1533) Weight:  [81.6 kg (180 lb)] 81.6 kg (180 lb) (10/26 1524) Constitutional:  Alert and oriented, No acute distress Cardiovascular: Regular rate and rhythm, No JVD Respiratory: Normal respiratory effort, Lungs clear bilaterally GI: Abdomen is soft, nontender, nondistended, no abdominal masses GU: No  CVA tenderness Lymphatic: No lymphadenopathy Neurologic: Grossly intact, no focal deficits Psychiatric: Normal mood and affect  Laboratory Data:  No results for input(s): WBC, HGB, HCT, PLT in the last 72 hours.  No results for input(s): NA, K, CL, GLUCOSE, BUN, CALCIUM, CREATININE in the last 72 hours.  Invalid input(s): CO3   No results found for this or any previous visit (from the past 24 hour(s)). Recent Results (from the past 240 hour(s))  Urine culture     Status: Abnormal   Collection Time: 11/05/16  4:26 PM  Result Value Ref Range Status   Specimen Description URINE, RANDOM  Final   Special Requests NONE  Final   Culture <10,000 COLONIES/mL (A)  Final   Report Status 11/06/2016 FINAL  Final    Renal Function:  Recent Labs  11/05/16 1036  CREATININE 1.93*   Estimated Creatinine Clearance: 34.9 mL/min (A) (by C-G formula based on SCr of 1.93 mg/dL (H)).  Radiologic Imaging: No results found.  I independently reviewed the above imaging studies.  Assessment and Plan [name] is a 56 y.o. female with bilateral hydronephrosis and possible retroperitoneal fibrosis  -The risks, benefits and alternatives of cystoscopy with bilateral RPG and bilateral stent placement was discussed with the patient.  She voices understanding and wishes to proceed.    Ellison Hughs, MD 11/09/2016, 4:25 PM  [facility] Pager: [phone]

## 2016-11-09 NOTE — Telephone Encounter (Signed)
LVM, if pt calls, please find out why she needs a referral to endocrinology. Ottis Stain, CMA

## 2016-11-09 NOTE — Anesthesia Procedure Notes (Signed)
Procedure Name: Intubation Date/Time: 11/09/2016 6:46 PM Performed by: Cynda Familia Pre-anesthesia Checklist: Patient identified, Emergency Drugs available, Suction available and Patient being monitored Patient Re-evaluated:Patient Re-evaluated prior to induction Oxygen Delivery Method: Circle System Utilized Preoxygenation: Pre-oxygenation with 100% oxygen Induction Type: IV induction Ventilation: Mask ventilation without difficulty Laryngoscope Size: Miller and 2 Grade View: Grade I Tube type: Oral Number of attempts: 1 Airway Equipment and Method: Stylet Placement Confirmation: ETT inserted through vocal cords under direct vision,  positive ETCO2 and breath sounds checked- equal and bilateral Secured at: 21 cm Tube secured with: Tape Dental Injury: Teeth and Oropharynx as per pre-operative assessment  Comments: Smooth IV induction Gifford Shave--- intubation AM CRNA atraumatic--- teeth and mouth as preop--- bilat BS Gifford Shave

## 2016-11-09 NOTE — Anesthesia Postprocedure Evaluation (Signed)
Anesthesia Post Note  Patient: ROBBIE NANGLE  Procedure(s) Performed: CYSTOSCOPY WITH RETROGRADE PYELOGRAM, BILATERAL URETEROSCOPY, LASER  AND BILATERAL STENT PLACEMENT (Bilateral Bladder)     Patient location during evaluation: PACU Anesthesia Type: General Level of consciousness: awake and alert, awake and oriented Pain management: pain level controlled Vital Signs Assessment: post-procedure vital signs reviewed and stable Respiratory status: spontaneous breathing, nonlabored ventilation and respiratory function stable Cardiovascular status: blood pressure returned to baseline and stable Postop Assessment: no apparent nausea or vomiting Anesthetic complications: no    Last Vitals:  Vitals:   11/09/16 1945 11/09/16 2000  BP: (!) 156/91 (!) 166/86  Pulse: 81 83  Resp: 13   Temp:  36.6 C  SpO2: 100% 96%    Last Pain:  Vitals:   11/09/16 1612  TempSrc:   PainSc: 0-No pain                 Catalina Gravel

## 2016-11-09 NOTE — Transfer of Care (Signed)
Immediate Anesthesia Transfer of Care Note  Patient: Diane Clark  Procedure(s) Performed: CYSTOSCOPY WITH RETROGRADE PYELOGRAM, BILATERAL URETEROSCOPY, LASER  AND BILATERAL STENT PLACEMENT (Bilateral Bladder)  Patient Location: PACU  Anesthesia Type:General  Level of Consciousness: sedated  Airway & Oxygen Therapy: Patient Spontanous Breathing and Patient connected to face mask oxygen  Post-op Assessment: Report given to RN and Post -op Vital signs reviewed and stable  Post vital signs: Reviewed and stable  Last Vitals:  Vitals:   11/09/16 1923 11/09/16 1930  BP: (!) 163/90 (!) 154/86  Pulse: (!) 101 90  Resp: 11 14  Temp: 36.8 C   SpO2: 100% 100%    Last Pain:  Vitals:   11/09/16 1612  TempSrc:   PainSc: 0-No pain      Patients Stated Pain Goal: 3 (95/74/73 4037)  Complications: No apparent anesthesia complications

## 2016-11-09 NOTE — Anesthesia Procedure Notes (Signed)
Date/Time: 11/09/2016 7:17 PM Performed by: Cynda Familia Pre-anesthesia Checklist: Patient identified, Suction available, Patient being monitored and Timeout performed Oxygen Delivery Method: Simple face mask Placement Confirmation: positive ETCO2 and breath sounds checked- equal and bilateral Dental Injury: Teeth and Oropharynx as per pre-operative assessment  Comments: Simple face mask after extubation --- good AW to PACU

## 2016-11-09 NOTE — Interval H&P Note (Signed)
History and Physical Interval Note:  11/09/2016 4:28 PM  Diane Clark  has presented today for surgery, with the diagnosis of bilateral ureteral obstruction  The various methods of treatment have been discussed with the patient and family. After consideration of risks, benefits and other options for treatment, the patient has consented to  Procedure(s): CYSTOSCOPY WITH RETROGRADE PYELOGRAM, BILATERAL URETEROSCOPY, LASER  AND BILATERAL STENT PLACEMENT (Bilateral) as a surgical intervention .  The patient's history has been reviewed, patient examined, no change in status, stable for surgery.  I have reviewed the patient's chart and labs.  Questions were answered to the patient's satisfaction.     Conception Oms [name]

## 2016-11-09 NOTE — Anesthesia Preprocedure Evaluation (Addendum)
Anesthesia Evaluation  Patient identified by MRN, date of birth, ID band Patient awake    Reviewed: Allergy & Precautions, NPO status , Patient's Chart, lab work & pertinent test results, reviewed documented beta blocker date and time   Airway Mallampati: II  TM Distance: >3 FB Neck ROM: Full    Dental  (+) Teeth Intact, Dental Advisory Given   Pulmonary former smoker,    Pulmonary exam normal breath sounds clear to auscultation       Cardiovascular hypertension, Pt. on home beta blockers and Pt. on medications Normal cardiovascular exam Rhythm:Regular Rate:Normal     Neuro/Psych Seizures -,     GI/Hepatic negative GI ROS, Neg liver ROS,   Endo/Other  negative endocrine ROS  Renal/GU Renal InsufficiencyRenal diseasebilateral hydronephrosis and possible retroperitoneal fibrosis     Musculoskeletal  (+) Arthritis , Osteoarthritis,    Abdominal   Peds  Hematology  (+) Blood dyscrasia, anemia ,   Anesthesia Other Findings Day of surgery medications reviewed with the patient.  Reproductive/Obstetrics                             Anesthesia Physical Anesthesia Plan  ASA: II  Anesthesia Plan: General   Post-op Pain Management:    Induction: Intravenous  PONV Risk Score and Plan: 4 or greater and Ondansetron, Dexamethasone, Midazolam, Treatment may vary due to age or medical condition and Scopolamine patch - Pre-op  Airway Management Planned: Oral ETT  Additional Equipment:   Intra-op Plan:   Post-operative Plan: Extubation in OR  Informed Consent: I have reviewed the patients History and Physical, chart, labs and discussed the procedure including the risks, benefits and alternatives for the proposed anesthesia with the patient or authorized representative who has indicated his/her understanding and acceptance.   Dental advisory given  Plan Discussed with: CRNA  Anesthesia Plan  Comments: (Risks/benefits of general anesthesia discussed with patient including risk of damage to teeth, lips, gum, and tongue, nausea/vomiting, allergic reactions to medications, and the possibility of heart attack, stroke and death.  All patient questions answered.  Patient wishes to proceed.)       Anesthesia Quick Evaluation

## 2016-11-09 NOTE — Op Note (Signed)
Operative Note  Preoperative diagnosis:  1.  Bilateral ureteral obstruction  Postoperative diagnosis: 1.  Urethral stenosis 2.  Bilateral ureteral obstruction secondary to stenosis of both ureters at the level of L4-L5  Procedure(s): 1.  Urethral dilation 2.  Cystoscopy 3.  Bilateral retrograde pyelograms 4.  Bilateral 6 [place] JJ stent placement  Surgeon: Ellison Hughs, MD  Assistants:  None  Anesthesia:  General LMA  Complications:  None  EBL:  <5 mL  Specimens: 1. None  Drains/Catheters: 1.  Bilateral 6 [place] JJ stents w/o tether  Intraoperative findings:   1.  Stenotic urethral meatus 2.  Bilateral ureteral obstruction secondary to stenosis of both ureters at the level of L4-L5.  The length of stenosis was approximately 4 cm, bilaterally.  Both ureters appeared medially deviated on RPG  Indication:  Diane Clark is a 56 y.o. female with bilateral ureteral obstruction and hydronephrosis seen on CT abd/pel wo from 11/05/16.  The CT had signs concerning for possible extrinsic compression of the ureters secondary to retroperitoneal fibrosis Description of procedure:  After informed consent was obtained, the patient was brought to the operating room and general LMA anesthesia was administered. The patient was then placed in the dorsolithotomy position and prepped and draped in usual sterile fashion. A timeout was performed. An attempt was then made to insert a 21 French rigid cystoscope, but her urethral meatus was too stenotic to accommodate the scope. Her urethra was then sequentially dilated with female sounds starting at 37 French and progressing up to 20 [place], in 2 [place] increments. Following dilation, the rigid cystoscope was then easily inserted and advanced into the bladder. A complete bladder survey revealed no intravesical pathology.  Her left ureteral orifice was identified and a floppy-tipped Glidewire was then used to intubate the orifice and was  advanced up to the left renal pelvis, under fluoroscopic guidance. A 6 French ureteral catheter was then advanced over the wire and placed into position within the distal aspects of the left ureter. A left retrograde pyelogram was obtained that showed an area of severe stenosis measuring approximately 4 cm at the level of L4-L5. The remainder of the proximal ureter was markedly dilated along with the left renal pelvis. No other filling defects were identified. The Glidewire was then reinserted and advanced up to the left renal pelvis, under fluoroscopic guidance. A 6 [place] JJ stent was then placed over the wire and into good position within the left collecting system, confirming placement via fluoroscopy.  A similar maneuver was then carried out on the right side. A right retrograde pyelogram was obtained that showed an area of severe stenosis of the right ureter at the same level as the left side, again spanning approximately 4 cm. The proximal right ureter was markedly dilated along with the right renal pelvis. No other filling defects were seen on retrograde pyelogram. The Glidewire was then readvanced up to the right renal pelvis, under fluoroscopic guidance. A 6 [place] JJ stent was then placed over the wire and into good position within the right collecting system, confirming placement via fluoroscopy. The patient's bladder was then drained. She tolerated the procedure well and was transferred to the postanesthesia in stable condition.  Plan: Monitor the patient overnight and recheck a BMP in the morning. She'll likely need a contrasted CT once her renal function normalizes to assess for possible retroperitoneal fibrosis as the source of her bilateral ureteral stenosis.

## 2016-11-10 ENCOUNTER — Encounter (HOSPITAL_COMMUNITY): Payer: Self-pay | Admitting: Urology

## 2016-11-10 DIAGNOSIS — I10 Essential (primary) hypertension: Secondary | ICD-10-CM | POA: Diagnosis not present

## 2016-11-10 DIAGNOSIS — N3592 Unspecified urethral stricture, female: Secondary | ICD-10-CM | POA: Diagnosis not present

## 2016-11-10 DIAGNOSIS — Z79899 Other long term (current) drug therapy: Secondary | ICD-10-CM | POA: Diagnosis not present

## 2016-11-10 DIAGNOSIS — N131 Hydronephrosis with ureteral stricture, not elsewhere classified: Secondary | ICD-10-CM | POA: Diagnosis not present

## 2016-11-10 DIAGNOSIS — N201 Calculus of ureter: Secondary | ICD-10-CM | POA: Diagnosis not present

## 2016-11-10 DIAGNOSIS — M1990 Unspecified osteoarthritis, unspecified site: Secondary | ICD-10-CM | POA: Diagnosis not present

## 2016-11-10 DIAGNOSIS — Z87891 Personal history of nicotine dependence: Secondary | ICD-10-CM | POA: Diagnosis not present

## 2016-11-10 DIAGNOSIS — Z791 Long term (current) use of non-steroidal anti-inflammatories (NSAID): Secondary | ICD-10-CM | POA: Diagnosis not present

## 2016-11-10 DIAGNOSIS — N132 Hydronephrosis with renal and ureteral calculous obstruction: Secondary | ICD-10-CM | POA: Diagnosis not present

## 2016-11-10 LAB — BASIC METABOLIC PANEL
ANION GAP: 10 (ref 5–15)
BUN: 13 mg/dL (ref 6–20)
CO2: 28 mmol/L (ref 22–32)
Calcium: 8.8 mg/dL — ABNORMAL LOW (ref 8.9–10.3)
Chloride: 109 mmol/L (ref 101–111)
Creatinine, Ser: 1.51 mg/dL — ABNORMAL HIGH (ref 0.44–1.00)
GFR calc Af Amer: 43 mL/min — ABNORMAL LOW (ref >60)
GFR, EST NON AFRICAN AMERICAN: 37 mL/min — AB (ref >60)
GLUCOSE: 123 mg/dL — AB (ref 65–99)
POTASSIUM: 3.6 mmol/L (ref 3.5–5.1)
SODIUM: 147 mmol/L — AB (ref 135–145)

## 2016-11-10 MED ORDER — SULFAMETHOXAZOLE-TRIMETHOPRIM 800-160 MG PO TABS: 1.0000 | ORAL_TABLET | Freq: Two times a day (BID) | ORAL | 0 refills | Status: AC

## 2016-11-10 MED ORDER — OXYBUTYNIN CHLORIDE ER 5 MG PO TB24: 5.0000 mg | ORAL_TABLET | Freq: Every day | ORAL | 1 refills | Status: DC

## 2016-11-10 MED ORDER — ONDANSETRON HCL 4 MG PO TABS: 4.0000 mg | ORAL_TABLET | Freq: Every day | ORAL | 1 refills | Status: DC | PRN

## 2016-11-10 MED ORDER — TRAMADOL HCL 50 MG PO TABS
50.0000 mg | ORAL_TABLET | Freq: Four times a day (QID) | ORAL | 0 refills | Status: DC | PRN
Start: 2016-11-10 — End: 2016-12-13

## 2016-11-10 NOTE — Progress Notes (Signed)
Pt given discharge teaching and medications reviewed with Pt. On AVS Medication list noted to have Keflex PO which Pt was on prior to admission to hospital and new medication Prescription to start [facility] DS 2 times daily. Clarified with Dr. Lovena Neighbours and Pt informed to stop Keflex and start Bactrum DS 2 times daily.  MD to update AVS and Pt informed of medication changes and understanding verbalized. Note written on AVS as a reminder of medication changes. Pt verbalized understanding of all discharge teaching/instructions. Pt ddischarged to home via wheelchair with discharge packet and Prescriptions.

## 2016-11-12 ENCOUNTER — Encounter (HOSPITAL_COMMUNITY): Payer: Self-pay | Admitting: Urology

## 2016-11-13 NOTE — Discharge Summary (Signed)
Date of admission: 11/09/2016  Date of discharge: 11/10/2016  Admission diagnosis: Bilateral hydronephrosis  Discharge diagnosis: Bilateral hydronephrosis secondary to extrinsic ureteral obstruction   History and Physical: For full details, please see admission history and physical. Briefly, Diane Clark is a 56 y.o. year old patient with bilateral hyrnephrosis and suspected extrinsic ureteral obstruction.   Hospital Course: The patient was monitored on the floor post-operatively with no acute events.    Laboratory values: No results for input(s): HGB, HCT in the last 72 hours. No results for input(s): CREATININE in the last 72 hours.  Disposition: Home  Discharge instruction: The patient was instructed to be ambulatory but told to refrain from heavy lifting, strenuous activity, or driving. No driving while taking pain medication  Discharge medications:  Allergies as of 11/10/2016   No Known Allergies     Medication List    TAKE these medications   acetaminophen 500 MG tablet Commonly known as:  TYLENOL Take 500 mg by mouth every 6 (six) hours as needed for moderate pain.   cephALEXin 500 MG capsule Commonly known as:  KEFLEX Take 1 capsule (500 mg total) by mouth 2 (two) times daily. Notes to patient:  Per MD Please stop taking this medication and start taking Bactrum DS 2 times Daily Prescription given    DULoxetine 60 MG capsule Commonly known as:  CYMBALTA Take 1 capsule (60 mg total) by mouth daily.   lisinopril 40 MG tablet Commonly known as:  PRINIVIL,ZESTRIL TAKE 1 TABLET BY MOUTH EVERY DAY   metoprolol succinate 50 MG 24 hr tablet Commonly known as:  TOPROL-XL TAKE 1 TABLET EVERY DAY   naproxen 500 MG tablet Commonly known as:  NAPROSYN TAKE 1 TABLET BY MOUTH TWICE A DAY WITH A MEAL AS NEEDED FOR PAIN   ondansetron 4 MG tablet Commonly known as:  ZOFRAN Take 1 tablet (4 mg total) by mouth daily as needed for nausea or vomiting.   oxybutynin 5 MG 24  hr tablet Commonly known as:  DITROPAN-XL Take 1 tablet (5 mg total) by mouth at bedtime.   sulfamethoxazole-trimethoprim 800-160 MG tablet Commonly known as:  BACTRIM DS,SEPTRA DS Take 1 tablet by mouth 2 (two) times daily. Notes to patient:  Per MD please start taking this medication as written and stop taking Keflex by mouth.   traMADol 50 MG tablet Commonly known as:  ULTRAM Take 1 tablet (50 mg total) by mouth every 6 (six) hours as needed.       Followup:  Follow-up Information    Ceasar Mons, MD In 2 weeks.   Specialty:  Urology Contact information: [address] [place] [address] [phone]

## 2016-11-22 DIAGNOSIS — N131 Hydronephrosis with ureteral stricture, not elsewhere classified: Secondary | ICD-10-CM | POA: Diagnosis not present

## 2016-11-23 ENCOUNTER — Encounter: Payer: Self-pay | Admitting: Internal Medicine

## 2016-12-13 ENCOUNTER — Ambulatory Visit: Payer: BLUE CROSS/BLUE SHIELD | Admitting: Internal Medicine

## 2016-12-13 ENCOUNTER — Encounter: Payer: Self-pay | Admitting: Internal Medicine

## 2016-12-13 ENCOUNTER — Encounter (INDEPENDENT_AMBULATORY_CARE_PROVIDER_SITE_OTHER): Payer: Self-pay

## 2016-12-13 VITALS — BP 130/70 | HR 92 | Ht 67.0 in | Wt 179.2 lb

## 2016-12-13 DIAGNOSIS — N135 Crossing vessel and stricture of ureter without hydronephrosis: Secondary | ICD-10-CM

## 2016-12-13 DIAGNOSIS — N1330 Unspecified hydronephrosis: Secondary | ICD-10-CM | POA: Diagnosis not present

## 2016-12-13 DIAGNOSIS — R110 Nausea: Secondary | ICD-10-CM

## 2016-12-13 DIAGNOSIS — K682 Retroperitoneal fibrosis: Secondary | ICD-10-CM

## 2016-12-13 DIAGNOSIS — N131 Hydronephrosis with ureteral stricture, not elsewhere classified: Secondary | ICD-10-CM | POA: Diagnosis not present

## 2016-12-13 MED ORDER — ONDANSETRON HCL 8 MG PO TABS: ORAL_TABLET | ORAL | 0 refills | Status: DC

## 2016-12-13 NOTE — Patient Instructions (Signed)
We are giving you a printed rx for ondansetron today to have filled.   I appreciate the opportunity to care for you. Silvano Rusk, MD, [facility]

## 2016-12-13 NOTE — Progress Notes (Signed)
Diane Clark 56 y.o. Jan 28, 1960 [id number]  Referred by: Mercy Riding, MD [place], [place] [address]   And Dr. Aleen Campi  Assessment & Plan:   Encounter Diagnoses  Name Primary?  . Nausea without vomiting Yes  . Retroperitoneal fibrosis   . Bilateral hydronephrosis    I think nausea and rare vomiting are related to underlying problems of retroperitoneal fibrosis and hydronephrosis/stenting of ureters. Do not think this is a GI problem per se. She needs management of RP fibrosis. In the meantime symptomatic Rx - will Rx ondansetron 8 mg which can be broken in half and allow for more freq dosing w/o running out of Rx.  We did spend some time reviewing what IO could tell her about RP fibrosis - seems like she has idiopathic.  I advised next steps on that per Dr. Lovena Neighbours.  OY:DXAJO, Charlesetta Ivory, MD Dr. Gerald Stabs Lovena Neighbours [facility]  Subjective:   Chief Complaint: nausea   HPI Very nice 56 yo AA woman w/ recent dx retroperitoneal fibrosis and gbilateral hydronephrosis (CT 10/2016) - s/p bilateral ureteral stents. She started out w/ vague bloating in abdomen and saw urgent care - tried MiraLax then presented to ED w/ dx as above. She is better after stenting but c/o queasiness qd helped by ondansetron - however can only get 12 4 mg ondansetron/2 weeks covered by insurance. Has paid some cash for extra. No sig GI Hx. Weight down w/ appetite changes, illness. Wt Readings from Last 3 Encounters:  12/13/16 179 lb 4 oz (81.3 kg)  11/09/16 180 lb 12.4 oz (82 kg)  10/24/16 192 lb 9.6 oz (87.4 kg)   Says Cologuard negative in past year. No bowel changes. No bleeding. Mild night sweats.   She had CT w/ w.o contrast today and I reviewed the results of stents but persistent hydronephrosis and RP fibrosis - I erroneously indicated that stents had decompressed things - will contact her about this to clarify  No Known Allergies Current Meds  Medication Sig  .  acetaminophen (TYLENOL) 500 MG tablet Take 500 mg by mouth every 6 (six) hours as needed for moderate pain.  . DULoxetine (CYMBALTA) 60 MG capsule Take 1 capsule (60 mg total) by mouth daily.  Marland Kitchen lisinopril (PRINIVIL,ZESTRIL) 40 MG tablet TAKE 1 TABLET BY MOUTH EVERY DAY  . metoprolol succinate (TOPROL-XL) 50 MG 24 hr tablet TAKE 1 TABLET EVERY DAY  . ondansetron (ZOFRAN) 4 MG tablet Take 1 tablet (4 mg total) by mouth daily as needed for nausea or vomiting.   Past Medical History:  Diagnosis Date  . Arthritis    knee and shoulders. Takes cymbalta  . Hydronephrosis   . Hypertension   . Seizures ([place])    with fever. Last one age 56   Past Surgical History:  Procedure Laterality Date  . CERVICAL CONE BIOPSY  1990  . CYSTOSCOPY WITH RETROGRADE PYELOGRAM, URETEROSCOPY AND STENT PLACEMENT Bilateral 11/09/2016   Procedure: CYSTOSCOPY WITH RETROGRADE PYELOGRAM, BILATERAL URETEROSCOPY,   AND BILATERAL STENT PLACEMENT;  Surgeon: Ceasar Mons, MD;  Location: [facility];  Service: Urology;  Laterality: Bilateral;   Social History   Social History Narrative   Single no kids   Works at [employer] contracts   2 EtOH/day   No drugs/tobacco   4 caffeine/day      12/13/2016   family history includes Diabetes in her mother; Heart disease in her mother; Hypertension in her sister; Kidney  failure in her mother and sister.   Review of Systems As per HPI  Objective:   Physical Exam @BP  130/70 (BP Location: Left Arm, Patient Position: Sitting, Cuff Size: Normal)   Pulse 92   Ht 5\' 7"  (1.702 m) Comment: height measured without shoes  Wt 179 lb 4 oz (81.3 kg)   LMP 03/08/2010   BMI 28.07 kg/m @  General:  Well-developed, well-nourished and in no acute distress Eyes:  anicteric. Neck:   supple w/o thyromegaly or mass.  Lungs: Clear to auscultation bilaterally. Heart:  S1S2, no rubs, murmurs, gallops. Abdomen:  soft, non-tender, no  hepatosplenomegaly, hernia, or mass and BS+.  Lymph:  no cervical, axillary or supraclavicular adenopathy. Extremities:   no edema, cyanosis or clubbing Skin   no rash. Neuro:  A&O x 3.  Psych:  appropriate mood and  Affect.   Data Reviewed: CT scans, ED notes, Labs (creat 0.5 down from 1.5 after stenting)

## 2016-12-14 ENCOUNTER — Telehealth: Payer: Self-pay

## 2016-12-14 NOTE — Telephone Encounter (Signed)
Patient notified She thanks you for the call, but she doesn't remember you telling her that was resolved.

## 2016-12-14 NOTE — Telephone Encounter (Signed)
-----   Message from Gatha Mayer, MD sent at 12/13/2016  6:24 PM EST ----- Regarding: correction to call Please call her and explain that while her kidney function is back to normal I mistakenly told her that the ureteral stents had relived the hydronephrosis or obstructed and dilated ureters.  This will be discussed by GU Dr. Lovena Neighbours I am sure but I wanted to correct that.  It does not change anything else we discussed.

## 2016-12-20 DIAGNOSIS — N131 Hydronephrosis with ureteral stricture, not elsewhere classified: Secondary | ICD-10-CM | POA: Diagnosis not present

## 2016-12-25 ENCOUNTER — Other Ambulatory Visit: Payer: Self-pay | Admitting: Urology

## 2016-12-26 ENCOUNTER — Other Ambulatory Visit: Payer: Self-pay | Admitting: Urology

## 2016-12-28 ENCOUNTER — Other Ambulatory Visit: Payer: Self-pay

## 2016-12-28 ENCOUNTER — Encounter (HOSPITAL_BASED_OUTPATIENT_CLINIC_OR_DEPARTMENT_OTHER): Payer: Self-pay | Admitting: *Deleted

## 2016-12-28 NOTE — Progress Notes (Signed)
Pt instructed npo pmn 12/18 x toprol w sip of water.  To [facility] 12/19 @ 0745.  Pt needs istat on arrival

## 2017-01-02 ENCOUNTER — Encounter (HOSPITAL_BASED_OUTPATIENT_CLINIC_OR_DEPARTMENT_OTHER): Payer: Self-pay

## 2017-01-02 ENCOUNTER — Ambulatory Visit (HOSPITAL_BASED_OUTPATIENT_CLINIC_OR_DEPARTMENT_OTHER)
Admission: RE | Admit: 2017-01-02 | Discharge: 2017-01-02 | Disposition: A | Discharge status: Home / Self Care | Payer: BLUE CROSS/BLUE SHIELD | Source: Ambulatory Visit | Attending: Urology | Admitting: Urology

## 2017-01-02 ENCOUNTER — Encounter (HOSPITAL_BASED_OUTPATIENT_CLINIC_OR_DEPARTMENT_OTHER)
Admission: RE | Disposition: A | Discharge status: Home / Self Care | Payer: Self-pay | Source: Ambulatory Visit | Attending: Urology

## 2017-01-02 ENCOUNTER — Ambulatory Visit (HOSPITAL_BASED_OUTPATIENT_CLINIC_OR_DEPARTMENT_OTHER): Payer: BLUE CROSS/BLUE SHIELD | Admitting: Anesthesiology

## 2017-01-02 DIAGNOSIS — I10 Essential (primary) hypertension: Secondary | ICD-10-CM | POA: Insufficient documentation

## 2017-01-02 DIAGNOSIS — N179 Acute kidney failure, unspecified: Secondary | ICD-10-CM | POA: Insufficient documentation

## 2017-01-02 DIAGNOSIS — Z79899 Other long term (current) drug therapy: Secondary | ICD-10-CM | POA: Diagnosis not present

## 2017-01-02 DIAGNOSIS — N135 Crossing vessel and stricture of ureter without hydronephrosis: Secondary | ICD-10-CM | POA: Diagnosis not present

## 2017-01-02 DIAGNOSIS — Z466 Encounter for fitting and adjustment of urinary device: Secondary | ICD-10-CM | POA: Diagnosis not present

## 2017-01-02 DIAGNOSIS — Z87891 Personal history of nicotine dependence: Secondary | ICD-10-CM | POA: Insufficient documentation

## 2017-01-02 DIAGNOSIS — N131 Hydronephrosis with ureteral stricture, not elsewhere classified: Secondary | ICD-10-CM | POA: Insufficient documentation

## 2017-01-02 HISTORY — PX: CYSTOSCOPY W/ URETERAL STENT PLACEMENT: SHX1429

## 2017-01-02 LAB — POCT I-STAT 4, (NA,K, GLUC, HGB,HCT)
Glucose, Bld: 83 mg/dL (ref 65–99)
HCT: 28 % — ABNORMAL LOW (ref 36.0–46.0)
Hemoglobin: 9.5 g/dL — ABNORMAL LOW (ref 12.0–15.0)
Potassium: 3.2 mmol/L — ABNORMAL LOW (ref 3.5–5.1)
SODIUM: 142 mmol/L (ref 135–145)

## 2017-01-02 SURGERY — CYSTOSCOPY, FLEXIBLE, WITH STENT REPLACEMENT
Anesthesia: General | Laterality: Bilateral

## 2017-01-02 MED ORDER — MIDAZOLAM HCL 2 MG/2ML IJ SOLN
INTRAMUSCULAR | Status: AC
  Filled 2017-01-02: qty 2

## 2017-01-02 MED ORDER — FENTANYL CITRATE (PF) 100 MCG/2ML IJ SOLN
25.0000 ug | INTRAMUSCULAR | Status: DC | PRN
Start: 2017-01-02 — End: 2017-01-02
  Filled 2017-01-02: qty 1

## 2017-01-02 MED ORDER — CEFAZOLIN SODIUM-DEXTROSE 2-4 GM/100ML-% IV SOLN
INTRAVENOUS | Status: AC
  Filled 2017-01-02: qty 100

## 2017-01-02 MED ORDER — TRAMADOL HCL 50 MG PO TABS: 50.0000 mg | ORAL_TABLET | Freq: Four times a day (QID) | ORAL | 0 refills | Status: DC | PRN

## 2017-01-02 MED ORDER — EPHEDRINE SULFATE-NACL 50-0.9 MG/10ML-% IV SOSY
PREFILLED_SYRINGE | INTRAVENOUS | Status: DC | PRN
  Administered 2017-01-02: 20 mg via INTRAVENOUS
  Administered 2017-01-02: 30 mg via INTRAVENOUS

## 2017-01-02 MED ORDER — SCOPOLAMINE 1 MG/3DAYS TD PT72
1.0000 | MEDICATED_PATCH | TRANSDERMAL | Status: DC
  Administered 2017-01-02: 1.5 mg via TRANSDERMAL
  Filled 2017-01-02: qty 1

## 2017-01-02 MED ORDER — EPHEDRINE 5 MG/ML INJ
INTRAVENOUS | Status: AC
  Filled 2017-01-02: qty 10

## 2017-01-02 MED ORDER — MIDAZOLAM HCL 5 MG/5ML IJ SOLN
INTRAMUSCULAR | Status: DC | PRN
  Administered 2017-01-02: 2 mg via INTRAVENOUS

## 2017-01-02 MED ORDER — ONDANSETRON HCL 4 MG/2ML IJ SOLN
INTRAMUSCULAR | Status: AC
  Filled 2017-01-02: qty 2

## 2017-01-02 MED ORDER — FENTANYL CITRATE (PF) 100 MCG/2ML IJ SOLN
INTRAMUSCULAR | Status: DC | PRN
  Administered 2017-01-02: 50 ug via INTRAVENOUS

## 2017-01-02 MED ORDER — ARTIFICIAL TEARS OPHTHALMIC OINT
TOPICAL_OINTMENT | OPHTHALMIC | Status: AC
  Filled 2017-01-02: qty 3.5

## 2017-01-02 MED ORDER — PROPOFOL 10 MG/ML IV BOLUS
INTRAVENOUS | Status: DC | PRN
  Administered 2017-01-02: 200 mg via INTRAVENOUS

## 2017-01-02 MED ORDER — PROPOFOL 10 MG/ML IV BOLUS
INTRAVENOUS | Status: AC
  Filled 2017-01-02: qty 40

## 2017-01-02 MED ORDER — CEFAZOLIN SODIUM-DEXTROSE 2-4 GM/100ML-% IV SOLN
2.0000 g | Freq: Once | INTRAVENOUS | Status: AC
Start: 2017-01-02 — End: 2017-01-02
  Administered 2017-01-02: 2 g via INTRAVENOUS
  Filled 2017-01-02: qty 100

## 2017-01-02 MED ORDER — FENTANYL CITRATE (PF) 100 MCG/2ML IJ SOLN
INTRAMUSCULAR | Status: AC
  Filled 2017-01-02: qty 2

## 2017-01-02 MED ORDER — DEXAMETHASONE SODIUM PHOSPHATE 4 MG/ML IJ SOLN
INTRAMUSCULAR | Status: DC | PRN
  Administered 2017-01-02: 10 mg via INTRAVENOUS

## 2017-01-02 MED ORDER — SCOPOLAMINE 1 MG/3DAYS TD PT72
MEDICATED_PATCH | TRANSDERMAL | Status: AC
  Filled 2017-01-02: qty 1

## 2017-01-02 MED ORDER — DEXAMETHASONE SODIUM PHOSPHATE 10 MG/ML IJ SOLN
INTRAMUSCULAR | Status: AC
  Filled 2017-01-02: qty 1

## 2017-01-02 MED ORDER — KETOROLAC TROMETHAMINE 30 MG/ML IJ SOLN
INTRAMUSCULAR | Status: AC
  Filled 2017-01-02: qty 1

## 2017-01-02 MED ORDER — PHENAZOPYRIDINE HCL 200 MG PO TABS: 200.0000 mg | ORAL_TABLET | Freq: Three times a day (TID) | ORAL | 0 refills | Status: AC | PRN

## 2017-01-02 MED ORDER — ONDANSETRON HCL 4 MG/2ML IJ SOLN
INTRAMUSCULAR | Status: DC | PRN
  Administered 2017-01-02: 4 mg via INTRAVENOUS

## 2017-01-02 MED ORDER — SULFAMETHOXAZOLE-TRIMETHOPRIM 800-160 MG PO TABS: 1.0000 | ORAL_TABLET | Freq: Two times a day (BID) | ORAL | 0 refills | Status: AC

## 2017-01-02 MED ORDER — LACTATED RINGERS IV SOLN
INTRAVENOUS | Status: DC
  Administered 2017-01-02: 1000 mL via INTRAVENOUS
  Filled 2017-01-02: qty 1000

## 2017-01-02 MED ORDER — PROMETHAZINE HCL 25 MG/ML IJ SOLN
6.2500 mg | INTRAMUSCULAR | Status: DC | PRN
  Filled 2017-01-02: qty 1

## 2017-01-02 SURGICAL SUPPLY — 27 items
APL SKNCLS STERI-STRIP NONHPOA (GAUZE/BANDAGES/DRESSINGS)
BAG DRAIN URO-CYSTO SKYTR STRL (DRAIN) ×2 IMPLANT
BAG DRN UROCATH (DRAIN) ×1
BASKET STONE 1.7 NGAGE (UROLOGICAL SUPPLIES) IMPLANT
BASKET ZERO TIP NITINOL 2.4FR (BASKET) ×1 IMPLANT
BENZOIN TINCTURE PRP APPL 2/3 (GAUZE/BANDAGES/DRESSINGS) IMPLANT
BSKT STON RTRVL ZERO TP 2.4FR (BASKET)
CATH INTERMIT  6FR 70CM (CATHETERS) ×1 IMPLANT
CLOTH BEACON ORANGE TIMEOUT ST (SAFETY) ×2 IMPLANT
FIBER LASER FLEXIVA 365 (UROLOGICAL SUPPLIES) IMPLANT
FIBER LASER TRAC TIP (UROLOGICAL SUPPLIES) IMPLANT
GLOVE BIO SURGEON STRL SZ7.5 (GLOVE) ×2 IMPLANT
GOWN STRL REUS W/TWL XL LVL3 (GOWN DISPOSABLE) IMPLANT
GUIDEWIRE ANG ZIPWIRE 038X150 (WIRE) ×1 IMPLANT
GUIDEWIRE STR DUAL SENSOR (WIRE) ×1 IMPLANT
INFUSOR MANOMETER BAG 3000ML (MISCELLANEOUS) ×1 IMPLANT
IV NS 1000ML (IV SOLUTION)
IV NS 1000ML BAXH (IV SOLUTION) IMPLANT
IV NS IRRIG 3000ML ARTHROMATIC (IV SOLUTION) ×2 IMPLANT
KIT RM TURNOVER CYSTO AR (KITS) ×2 IMPLANT
MANIFOLD NEPTUNE II (INSTRUMENTS) ×2 IMPLANT
NS IRRIG 500ML POUR BTL (IV SOLUTION) ×2 IMPLANT
PACK CYSTO (CUSTOM PROCEDURE TRAY) ×2 IMPLANT
STENT CONTOUR NO GW 8FR 26CM (STENTS) ×2 IMPLANT
STRIP CLOSURE SKIN 1/2X4 (GAUZE/BANDAGES/DRESSINGS) IMPLANT
SYRINGE 10CC LL (SYRINGE) ×1 IMPLANT
TUBE CONNECTING 12X1/4 (SUCTIONS) ×1 IMPLANT

## 2017-01-02 NOTE — Anesthesia Preprocedure Evaluation (Signed)
Anesthesia Evaluation  Patient identified by MRN, date of birth, ID band Patient awake    Reviewed: Allergy & Precautions, NPO status , Patient's Chart, lab work & pertinent test results, reviewed documented beta blocker date and time   History of Anesthesia Complications Negative for: history of anesthetic complications  Airway Mallampati: II  TM Distance: >3 FB     Dental no notable dental hx. (+) Dental Advisory Given   Pulmonary former smoker,    Pulmonary exam normal        Cardiovascular hypertension, Pt. on medications and Pt. on home beta blockers Normal cardiovascular exam     Neuro/Psych negative neurological ROS  negative psych ROS   GI/Hepatic negative GI ROS, Neg liver ROS,   Endo/Other  negative endocrine ROS  Renal/GU      Musculoskeletal   Abdominal   Peds  Hematology negative hematology ROS (+)   Anesthesia Other Findings   Reproductive/Obstetrics                             Anesthesia Physical Anesthesia Plan  ASA: II  Anesthesia Plan: General   Post-op Pain Management:    Induction: Intravenous  PONV Risk Score and Plan: 4 or greater and Ondansetron, Dexamethasone, Scopolamine patch - Pre-op and Diphenhydramine  Airway Management Planned: LMA  Additional Equipment:   Intra-op Plan:   Post-operative Plan: Extubation in OR  Informed Consent: I have reviewed the patients History and Physical, chart, labs and discussed the procedure including the risks, benefits and alternatives for the proposed anesthesia with the patient or authorized representative who has indicated his/her understanding and acceptance.   Dental advisory given  Plan Discussed with: CRNA and Anesthesiologist  Anesthesia Plan Comments:         Anesthesia Quick Evaluation

## 2017-01-02 NOTE — Interval H&P Note (Signed)
History and Physical Interval Note:  01/02/2017 8:30 AM  Diane Clark  has presented today for surgery, with the diagnosis of BILATERAL URETERAL OBSTRUCTION  The various methods of treatment have been discussed with the patient and family. After consideration of risks, benefits and other options for treatment, the patient has consented to  Procedure(s) with comments: CYSTOSCOPY WITH STENT REPLACEMENT (Bilateral) - ONLY NEEDS 30 MIN as a surgical intervention .  The patient's history has been reviewed, patient examined, no change in status, stable for surgery.  I have reviewed the patient's chart and labs.  Questions were answered to the patient's satisfaction.     Conception Oms Eduar Kumpf

## 2017-01-02 NOTE — Anesthesia Procedure Notes (Signed)
Procedure Name: LMA Insertion Date/Time: 01/02/2017 8:43 AM Performed by: Duane Boston, MD Pre-anesthesia Checklist: Patient identified, Emergency Drugs available, Suction available and Patient being monitored Patient Re-evaluated:Patient Re-evaluated prior to induction Oxygen Delivery Method: Circle system utilized Preoxygenation: Pre-oxygenation with 100% oxygen Induction Type: IV induction Ventilation: Mask ventilation without difficulty LMA: LMA inserted LMA Size: 4.0 Number of attempts: 1 Airway Equipment and Method: Bite block Placement Confirmation: positive ETCO2 Tube secured with: Tape Dental Injury: Teeth and Oropharynx as per pre-operative assessment

## 2017-01-02 NOTE — Op Note (Signed)
Operative Note  Preoperative diagnosis:  1.  Bilateral ureteral obstruction secondary to retroperitoneal fibrosis  Postoperative diagnosis: 1.  Same  Procedure(s): 1.  Cystoscopy with bilateral JJ stent exchange  Surgeon: Ellison Hughs, MD  Assistants:  None  Anesthesia:  Gen. LMA  Complications:  None  EBL:  Less than 5 ML  Specimens: 1.  Previously placed bilateral 6 [place] JJ stents were removed intact, inspected and discarded  Drains/Catheters: 1.  Bilateral 8 [place] JJ stents without tethers  Intraoperative findings:   1.  Medial deviation of the ureters, bilaterally. 2.  Bilateral JJ stents placed in good position  Indication:  Diane Clark is a 56 y.o. female with  idiopathic retroperitoneal fibrosis resulting in severe bilateral hydronephrosis requiring indwelling ureteral stents and acute kidney injury.  Her initial bilateral ureteral stent placement was on 11/09/16.  She did well immediately post-operatively, but subsequently developed worsening and persistent nausea with rare episodes of vomiting.  She was recently evaluated by Dr. Carlean Purl with gastroenterology and it was concluded that her nausea symptoms were likely related to her retroperitoneal fibrosis and ureteral obstruction  and was started on Zofran 8 mg.    She was last seen in my office on 12/20/16 to discuss her recent CT abd/pel wo/w contrast (which was consistent with extensive retroperitoneal fibrosis).  At that time the patient reported persistent nausea w/o vomiting and occasional episodes of hematuria and dysuria.  Follow-up BMP revealed that her creatinine had risen to 1.6 (up from 0.5 on 11/22/16).  She is here today for bilateral stent exchange in light of her recent decline in renal function and persistent nausea.  She has a pending referred to rheumatology for steroid therapy of her retroperitoneal fibrosis.     She has been consented for the above procedures, voices understanding and wishes  to proceed.   Description of procedure:  After informed consent was obtained, the patient was brought to the operating room and general LMA anesthesia was administered. The patient was then placed in the dorsolithotomy position and prepped and draped in usual sterile fashion. A timeout was performed. A 21 French rigid cystoscope was then inserted into the urethral meatus and advanced into the bladder under direct vision. A complete bladder survey revealed no intravesical pathology.  The distal aspects of the previously placed left JJ stent was grasped and retracted to the urethral meatus. A sensor wire was then used to intubate the lumen of the stent and was advanced up to the left renal pelvis, under fluoroscopic guidance. The previously placed stent was then removed over the wire intact, inspected and discarded. A new 8 [place] JJ stent was then placed over the wire and into good position within the right collecting system, confirming placement via fluoroscopy.  The right JJ stent was removed and an identical fashion, replacing it with a Barnett stent. The right-sided stent was placed in good position, confirming placement via fluoroscopy. The patient's bladder was then completely drained. She tolerated the procedure well and was transferred to the postanesthesia unit in stable condition.  Plan:  She will need to have her bilateral JJ stents exchanged every 3 months. Will recheck a BMP in 7-10 days. She has a pending appointment with rheumatology in early January to discuss possible steroid treatment of her retroperitoneal fibrosis.  CC: Dr. Wendee Beavers

## 2017-01-02 NOTE — H&P (Signed)
Urology Preoperative H&P   Chief Complaint: Bilateral hydronephrosis  History of Present Illness: Diane Clark is a 56 y.o. female with idiopathic retroperitoneal fibrosis resulting in severe bilateral hydronephrosis requiring indwelling ureteral stents and acute kidney injury.  Her initial bilateral ureteral stent placement was on 11/09/16.  She did well immediately post-operatively, but subsequently developed worsening and persistent nausea with rare episodes of vomiting.  She was recently evaluated by Dr. Carlean Purl with gastroenterology and it was concluded that her nausea symptoms were likely related to her retroperitoneal fibrosis and ureteral obstruction  and was started on Zofran 8 mg.    She was last seen in my office on 12/20/16 to discuss her recent CT abd/pel wo/w contrast (which was consistent with extensive retroperitoneal fibrosis).  At that time the patient reported persistent nausea w/o vomiting and occasional episodes of hematuria and dysuria.  Follow-up BMP revealed that her creatinine had risen to 1.6 (up from 0.5 on 11/22/16).  She is here today for bilateral stent exchange in light of her recent decline in renal function and persistent nausea.  She has a pending referred to rheumatology for steroid therapy of her retroperitoneal fibrosis.     Past Medical History:  Diagnosis Date  . Arthritis    knee and shoulders. Takes cymbalta  . Constipation   . Hydronephrosis   . Hypertension   . Seizures ([place])    with fever. Last one age 57    Past Surgical History:  Procedure Laterality Date  . CERVICAL CONE BIOPSY  1990  . CYSTOSCOPY WITH RETROGRADE PYELOGRAM, URETEROSCOPY AND STENT PLACEMENT Bilateral 11/09/2016   Procedure: CYSTOSCOPY WITH RETROGRADE PYELOGRAM, BILATERAL URETEROSCOPY,   AND BILATERAL STENT PLACEMENT;  Surgeon: Ceasar Mons, MD;  Location: [facility];  Service: Urology;  Laterality: Bilateral;    Allergies:  Allergies  Allergen Reactions  .  Lidocaine Swelling    Pt states "my arm swelled" at the site    Family History  Problem Relation Age of Onset  . Kidney failure Mother   . Diabetes Mother   . Heart disease Mother   . Kidney failure Sister        kidney transplant  . Hypertension Sister     Social History:  reports that she quit smoking about 30 years ago. she has never used smokeless tobacco. She reports that she drinks alcohol. She reports that she does not use drugs.  ROS: A complete review of systems was performed.  All systems are negative except for pertinent findings as noted.  Physical Exam:  Vital signs in last 24 hours:   Constitutional:  Alert and oriented, No acute distress Cardiovascular: Regular rate and rhythm, No JVD Respiratory: Normal respiratory effort, Lungs clear bilaterally GI: Abdomen is soft, nontender, nondistended, no abdominal masses GU: No CVA tenderness Lymphatic: No lymphadenopathy Neurologic: Grossly intact, no focal deficits Psychiatric: Normal mood and affect  Laboratory Data:  No results for input(s): WBC, HGB, HCT, PLT in the last 72 hours.  No results for input(s): NA, K, CL, GLUCOSE, BUN, CALCIUM, CREATININE in the last 72 hours.  Invalid input(s): CO3   No results found for this or any previous visit (from the past 24 hour(s)). No results found for this or any previous visit (from the past 240 hour(s)).  Renal Function: No results for input(s): CREATININE in the last 168 hours. CrCl cannot be calculated (Patient's most recent lab result is older than the maximum 21 days allowed.).  Radiologic Imaging: No results found.  I  independently reviewed the above imaging studies.  Assessment and Plan Diane Clark is a 56 y.o. female with extensive retroperitoneal fibrosis resulting in severe bilateral hydronephrosis  -The risks, benefits and alternatives of cystoscopy with bilateral ureteral stent exchange was discussed with the patient.  She voices understanding  and wishes to proceed.     Ellison Hughs, MD 01/02/2017, 5:23 AM  [facility] Pager: [phone]

## 2017-01-02 NOTE — Discharge Instructions (Signed)
CYSTOSCOPY HOME CARE INSTRUCTIONS  Activity: Rest for the remainder of the day.  Do not drive or operate equipment today.  You may resume normal activities in one to two days as instructed by your physician.   Meals: Drink plenty of liquids and eat light foods such as gelatin or soup this evening.  You may return to a normal meal plan tomorrow.  Return to Work: You may return to work in one to two days or as instructed by your physician.  Special Instructions / Symptoms: Call your physician if any of these symptoms occur:   -persistent or heavy bleeding  -large blood clots that are difficult to pass  -urine stream diminishes or stops completely  -fever equal to or higher than 101 degrees Farenheit.  -cloudy urine with a strong, foul odor  -severe pain  Females should always wipe from front to back after elimination.  You may feel some burning pain when you urinate.  This should disappear with time.  Applying moist heat to the lower abdomen or a hot tub bath may help relieve the pain.       Post Anesthesia Home Care Instructions  Activity: Get plenty of rest for the remainder of the day. A responsible individual must stay with you for 24 hours following the procedure.  For the next 24 hours, DO NOT: -Drive a car -Paediatric nurse -Drink alcoholic beverages -Take any medication unless instructed by your physician -Make any legal decisions or sign important papers.  Meals: Start with liquid foods such as gelatin or soup. Progress to regular foods as tolerated. Avoid greasy, spicy, heavy foods. If nausea and/or vomiting occur, drink only clear liquids until the nausea and/or vomiting subsides. Call your physician if vomiting continues.  Special Instructions/Symptoms: Your throat may feel dry or sore from the anesthesia or the breathing tube placed in your throat during surgery. If this causes discomfort, gargle with warm salt water. The discomfort should disappear within 24  hours.  If you had a scopolamine patch placed behind your ear for the management of post- operative nausea and/or vomiting:  1. The medication in the patch is effective for 72 hours, after which it should be removed.  Wrap patch in a tissue and discard in the trash. Wash hands thoroughly with soap and water. 2. You may remove the patch earlier than 72 hours if you experience unpleasant side effects which may include dry mouth, dizziness or visual disturbances. 3. Avoid touching the patch. Wash your hands with soap and water after contact with the patch.

## 2017-01-02 NOTE — Transfer of Care (Signed)
  Last Vitals:  Vitals:   01/02/17 0630  BP: 123/77  Pulse: 79  Resp: 16  Temp: 36.9 C  SpO2: 100%    Last Pain:  Vitals:   01/02/17 0630  TempSrc: Oral      Patients Stated Pain Goal: 2 (01/02/17 0706)  Immediate Anesthesia Transfer of Care Note  Patient: Diane Clark  Procedure(s) Performed: Procedure(s) (LRB): CYSTOSCOPY WITH STENT REPLACEMENT (Bilateral)  Patient Location: PACU  Anesthesia Type: General  Level of Consciousness: awake, alert  and oriented  Airway & Oxygen Therapy: Patient Spontanous Breathing and Patient connected to nasal cannula oxygen  Post-op Assessment: Report given to PACU RN and Post -op Vital signs reviewed and stable  Post vital signs: Reviewed and stable  Complications: No apparent anesthesia complications

## 2017-01-02 NOTE — Anesthesia Postprocedure Evaluation (Signed)
Anesthesia Post Note  Patient: Diane Clark  Procedure(s) Performed: CYSTOSCOPY WITH STENT REPLACEMENT (Bilateral )     Patient location during evaluation: PACU Anesthesia Type: General Level of consciousness: sedated Pain management: pain level controlled Vital Signs Assessment: post-procedure vital signs reviewed and stable Respiratory status: spontaneous breathing and respiratory function stable Cardiovascular status: stable Postop Assessment: no apparent nausea or vomiting Anesthetic complications: no    Last Vitals:  Vitals:   01/02/17 0936 01/02/17 1021  BP:  128/74  Pulse: 85 72  Resp: 16 17  Temp:  36.4 C  SpO2: 100% 100%    Last Pain:  Vitals:   01/02/17 1021  TempSrc: Oral  PainSc:                  Lylla Eifler DANIEL

## 2017-01-03 ENCOUNTER — Encounter (HOSPITAL_BASED_OUTPATIENT_CLINIC_OR_DEPARTMENT_OTHER): Payer: Self-pay | Admitting: Urology

## 2017-01-16 DIAGNOSIS — N131 Hydronephrosis with ureteral stricture, not elsewhere classified: Secondary | ICD-10-CM | POA: Diagnosis not present

## 2017-01-23 DIAGNOSIS — R5382 Chronic fatigue, unspecified: Secondary | ICD-10-CM | POA: Diagnosis not present

## 2017-01-23 DIAGNOSIS — N135 Crossing vessel and stricture of ureter without hydronephrosis: Secondary | ICD-10-CM | POA: Diagnosis not present

## 2017-01-25 ENCOUNTER — Other Ambulatory Visit: Payer: Self-pay

## 2017-01-25 ENCOUNTER — Encounter: Payer: Self-pay | Admitting: Student

## 2017-01-25 DIAGNOSIS — D483 Neoplasm of uncertain behavior of retroperitoneum: Secondary | ICD-10-CM | POA: Insufficient documentation

## 2017-01-25 DIAGNOSIS — R5382 Chronic fatigue, unspecified: Secondary | ICD-10-CM | POA: Insufficient documentation

## 2017-01-25 DIAGNOSIS — I10 Essential (primary) hypertension: Secondary | ICD-10-CM

## 2017-01-25 MED ORDER — METOPROLOL SUCCINATE ER 50 MG PO TB24: 50.0000 mg | ORAL_TABLET | Freq: Every day | ORAL | 0 refills | Status: DC

## 2017-02-20 DIAGNOSIS — R5382 Chronic fatigue, unspecified: Secondary | ICD-10-CM | POA: Diagnosis not present

## 2017-02-20 DIAGNOSIS — N135 Crossing vessel and stricture of ureter without hydronephrosis: Secondary | ICD-10-CM | POA: Diagnosis not present

## 2017-03-07 DIAGNOSIS — K689 Other disorders of retroperitoneum: Secondary | ICD-10-CM | POA: Diagnosis not present

## 2017-03-07 DIAGNOSIS — R110 Nausea: Secondary | ICD-10-CM | POA: Diagnosis not present

## 2017-03-07 DIAGNOSIS — N131 Hydronephrosis with ureteral stricture, not elsewhere classified: Secondary | ICD-10-CM | POA: Diagnosis not present

## 2017-03-15 DIAGNOSIS — L0390 Cellulitis, unspecified: Secondary | ICD-10-CM

## 2017-03-15 HISTORY — DX: Cellulitis, unspecified: L03.90

## 2017-03-26 ENCOUNTER — Other Ambulatory Visit: Payer: Self-pay | Admitting: Urology

## 2017-04-08 ENCOUNTER — Encounter (HOSPITAL_BASED_OUTPATIENT_CLINIC_OR_DEPARTMENT_OTHER): Payer: Self-pay | Admitting: *Deleted

## 2017-04-08 ENCOUNTER — Other Ambulatory Visit: Payer: Self-pay

## 2017-04-08 NOTE — Progress Notes (Signed)
SPOKE WITH Diane Clark NPO AFTER MIDNIGHT ARRIVE 04-17-17 630 AM MEDS TO TAKE SIP OF WATER METORPOLOL SUCCINATE, PREDNISONE WILL ARRANGE DRIVER 18 OR OLDER NEEDS I STAT 4 AND EKG

## 2017-04-11 DIAGNOSIS — Z960 Presence of urogenital implants: Secondary | ICD-10-CM | POA: Diagnosis not present

## 2017-04-11 DIAGNOSIS — R600 Localized edema: Secondary | ICD-10-CM | POA: Diagnosis not present

## 2017-04-11 DIAGNOSIS — N135 Crossing vessel and stricture of ureter without hydronephrosis: Secondary | ICD-10-CM | POA: Diagnosis not present

## 2017-04-16 NOTE — Anesthesia Preprocedure Evaluation (Signed)
Anesthesia Evaluation  Patient identified by MRN, date of birth, ID band Patient awake    Reviewed: Allergy & Precautions, NPO status , Patient's Chart, lab work & pertinent test results, reviewed documented beta blocker date and time   History of Anesthesia Complications Negative for: history of anesthetic complications  Airway Mallampati: II  TM Distance: >3 FB     Dental no notable dental hx. (+) Dental Advisory Given   Pulmonary former smoker,    Pulmonary exam normal        Cardiovascular hypertension, Pt. on medications and Pt. on home beta blockers Normal cardiovascular exam     Neuro/Psych negative neurological ROS  negative psych ROS   GI/Hepatic negative GI ROS, Neg liver ROS,   Endo/Other  negative endocrine ROS  Renal/GU      Musculoskeletal   Abdominal   Peds  Hematology negative hematology ROS (+)   Anesthesia Other Findings   Reproductive/Obstetrics                             Anesthesia Physical  Anesthesia Plan  ASA: II  Anesthesia Plan: General   Post-op Pain Management:    Induction: Intravenous  PONV Risk Score and Plan: 4 or greater and Ondansetron, Dexamethasone, Scopolamine patch - Pre-op and Diphenhydramine  Airway Management Planned: LMA  Additional Equipment:   Intra-op Plan:   Post-operative Plan: Extubation in OR  Informed Consent: I have reviewed the patients History and Physical, chart, labs and discussed the procedure including the risks, benefits and alternatives for the proposed anesthesia with the patient or authorized representative who has indicated his/her understanding and acceptance.   Dental advisory given  Plan Discussed with: CRNA and Anesthesiologist  Anesthesia Plan Comments:         Anesthesia Quick Evaluation

## 2017-04-16 NOTE — H&P (Signed)
Urology Preoperative H&P   Chief Complaint: Bilateral ureteral obstruction  History of Present Illness: Diane Clark is a 57 y.o. 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   Last Stent Exchange-01/02/17   She was recently evaluated by by Dr. Gavin Pound with [facility] and was started on a Prednisone steroid regimen to help treat her RPF. Today, she reports no flank pain, dysuria, hematuria or nausea. She is doing quite well since her last stent exchange and since starting her steroid regimen.      Past Medical History:  Diagnosis Date  . Ankle swelling LAST 2 WEEKS   RELIEVED BY PROPPING ANKLES UP   . Arthritis    knee and shoulders. Takes cymbalta  . Constipation   . History of kidney stones   . Hydronephrosis 10/2016   secondary to fibrosis  . Hypertension   . Seizures ([place])    with fever. Last one age 36    Past Surgical History:  Procedure Laterality Date  . CERVICAL CONE BIOPSY  1990  . CYSTOSCOPY W/ URETERAL STENT PLACEMENT Bilateral 01/02/2017   Procedure: CYSTOSCOPY WITH STENT REPLACEMENT;  Surgeon: Ceasar Mons, MD;  Location: [facility];  Service: Urology;  Laterality: Bilateral;  ONLY NEEDS 30 MIN  . CYSTOSCOPY WITH RETROGRADE PYELOGRAM, URETEROSCOPY AND STENT PLACEMENT Bilateral 11/09/2016   Procedure: CYSTOSCOPY WITH RETROGRADE PYELOGRAM, BILATERAL URETEROSCOPY,   AND BILATERAL STENT PLACEMENT;  Surgeon: Ceasar Mons, MD;  Location: [facility];  Service: Urology;  Laterality: Bilateral;    Allergies:  Allergies  Allergen Reactions  . Lidocaine Swelling    Pt states "my arm swelled" at the site    Family History  Problem Relation Age of Onset  . Kidney failure Mother   . Diabetes Mother   . Heart disease Mother   . Kidney failure Sister        kidney transplant  . Hypertension Sister     Social  History:  reports that she quit smoking about 30 years ago. Her smoking use included cigarettes. She has a 10.00 pack-year smoking history. She has never used smokeless tobacco. She reports that she drank alcohol. She reports that she does not use drugs.  ROS: A complete review of systems was performed.  All systems are negative except for pertinent findings as noted.  Physical Exam:  Vital signs in last 24 hours:   Constitutional:  Alert and oriented, No acute distress Cardiovascular: Regular rate and rhythm, No JVD Respiratory: Normal respiratory effort, Lungs clear bilaterally GI: Abdomen is soft, nontender, nondistended, no abdominal masses GU: No CVA tenderness Lymphatic: No lymphadenopathy Neurologic: Grossly intact, no focal deficits Psychiatric: Normal mood and affect  Laboratory Data:  No results for input(s): WBC, HGB, HCT, PLT in the last 72 hours.  No results for input(s): NA, K, CL, GLUCOSE, BUN, CALCIUM, CREATININE in the last 72 hours.  Invalid input(s): CO3   No results found for this or any previous visit (from the past 24 hour(s)). No results found for this or any previous visit (from the past 240 hour(s)).  Renal Function: No results for input(s): CREATININE in the last 168 hours. CrCl cannot be calculated (Patient's most recent lab result is older than the maximum 21 days allowed.).  Radiologic Imaging: No results found.  I independently reviewed the above imaging studies.  Assessment and Plan ZAYLA AGAR is a 57 y.o. female with bilateral ureteral obstruction 2/2 retroperitoneal  fibrosis  -The risks, benefits and alternatives of cystoscopy with bilateral stent exchange was discussed with the patient.  She voices understanding and wishes to proceed   Ellison Hughs, MD 04/16/2017, 9:10 PM  [facility] Pager: [phone]

## 2017-04-17 ENCOUNTER — Encounter (HOSPITAL_BASED_OUTPATIENT_CLINIC_OR_DEPARTMENT_OTHER)
Admission: RE | Disposition: A | Discharge status: Home / Self Care | Payer: Self-pay | Source: Ambulatory Visit | Attending: Urology

## 2017-04-17 ENCOUNTER — Ambulatory Visit (HOSPITAL_BASED_OUTPATIENT_CLINIC_OR_DEPARTMENT_OTHER)
Admission: RE | Admit: 2017-04-17 | Discharge: 2017-04-17 | Disposition: A | Discharge status: Home / Self Care | Payer: BLUE CROSS/BLUE SHIELD | Source: Ambulatory Visit | Attending: Urology | Admitting: Urology

## 2017-04-17 ENCOUNTER — Ambulatory Visit (HOSPITAL_BASED_OUTPATIENT_CLINIC_OR_DEPARTMENT_OTHER): Payer: BLUE CROSS/BLUE SHIELD | Admitting: Anesthesiology

## 2017-04-17 ENCOUNTER — Encounter (HOSPITAL_BASED_OUTPATIENT_CLINIC_OR_DEPARTMENT_OTHER): Payer: Self-pay

## 2017-04-17 ENCOUNTER — Other Ambulatory Visit: Payer: Self-pay

## 2017-04-17 DIAGNOSIS — Z87891 Personal history of nicotine dependence: Secondary | ICD-10-CM | POA: Diagnosis not present

## 2017-04-17 DIAGNOSIS — I10 Essential (primary) hypertension: Secondary | ICD-10-CM | POA: Insufficient documentation

## 2017-04-17 DIAGNOSIS — M170 Bilateral primary osteoarthritis of knee: Secondary | ICD-10-CM | POA: Diagnosis not present

## 2017-04-17 DIAGNOSIS — N131 Hydronephrosis with ureteral stricture, not elsewhere classified: Secondary | ICD-10-CM | POA: Insufficient documentation

## 2017-04-17 DIAGNOSIS — M1990 Unspecified osteoarthritis, unspecified site: Secondary | ICD-10-CM | POA: Diagnosis not present

## 2017-04-17 DIAGNOSIS — Z888 Allergy status to other drugs, medicaments and biological substances status: Secondary | ICD-10-CM | POA: Insufficient documentation

## 2017-04-17 DIAGNOSIS — Z79899 Other long term (current) drug therapy: Secondary | ICD-10-CM | POA: Insufficient documentation

## 2017-04-17 HISTORY — PX: CYSTOSCOPY WITH STENT PLACEMENT: SHX5790

## 2017-04-17 LAB — POCT I-STAT 4, (NA,K, GLUC, HGB,HCT)
GLUCOSE: 77 mg/dL (ref 65–99)
HEMATOCRIT: 32 % — AB (ref 36.0–46.0)
Hemoglobin: 10.9 g/dL — ABNORMAL LOW (ref 12.0–15.0)
Potassium: 3.1 mmol/L — ABNORMAL LOW (ref 3.5–5.1)
Sodium: 146 mmol/L — ABNORMAL HIGH (ref 135–145)

## 2017-04-17 SURGERY — CYSTOSCOPY, WITH STENT INSERTION
Anesthesia: General | Laterality: Bilateral

## 2017-04-17 MED ORDER — FENTANYL CITRATE (PF) 100 MCG/2ML IJ SOLN
INTRAMUSCULAR | Status: DC | PRN
  Administered 2017-04-17: 50 ug via INTRAVENOUS

## 2017-04-17 MED ORDER — ONDANSETRON HCL 4 MG/2ML IJ SOLN
INTRAMUSCULAR | Status: AC
  Filled 2017-04-17: qty 2

## 2017-04-17 MED ORDER — DEXAMETHASONE SODIUM PHOSPHATE 10 MG/ML IJ SOLN
INTRAMUSCULAR | Status: AC
Start: 2017-04-17 — End: 2017-04-17
  Filled 2017-04-17: qty 1

## 2017-04-17 MED ORDER — MIDAZOLAM HCL 2 MG/2ML IJ SOLN
INTRAMUSCULAR | Status: AC
  Filled 2017-04-17: qty 2

## 2017-04-17 MED ORDER — SODIUM CHLORIDE 0.9 % IV SOLN
INTRAVENOUS | Status: DC
  Administered 2017-04-17: 1000 mL via INTRAVENOUS
  Filled 2017-04-17: qty 1000

## 2017-04-17 MED ORDER — IOHEXOL 300 MG/ML  SOLN
INTRAMUSCULAR | Status: DC | PRN
Start: 2017-04-17 — End: 2017-04-17
  Administered 2017-04-17: 20 mL via INTRAVENOUS

## 2017-04-17 MED ORDER — MIDAZOLAM HCL 5 MG/5ML IJ SOLN
INTRAMUSCULAR | Status: DC | PRN
  Administered 2017-04-17: 2 mg via INTRAVENOUS

## 2017-04-17 MED ORDER — HYDROCODONE-ACETAMINOPHEN 5-325 MG PO TABS: 1.0000 | ORAL_TABLET | ORAL | 0 refills | Status: DC | PRN

## 2017-04-17 MED ORDER — DEXAMETHASONE SODIUM PHOSPHATE 4 MG/ML IJ SOLN
INTRAMUSCULAR | Status: DC | PRN
  Administered 2017-04-17: 10 mg via INTRAVENOUS

## 2017-04-17 MED ORDER — CIPROFLOXACIN IN D5W 400 MG/200ML IV SOLN
INTRAVENOUS | Status: AC
  Filled 2017-04-17: qty 200

## 2017-04-17 MED ORDER — EPHEDRINE 5 MG/ML INJ
INTRAVENOUS | Status: AC
  Filled 2017-04-17: qty 10

## 2017-04-17 MED ORDER — PROPOFOL 10 MG/ML IV BOLUS
INTRAVENOUS | Status: AC
Start: 2017-04-17 — End: 2017-04-17
  Filled 2017-04-17: qty 40

## 2017-04-17 MED ORDER — SCOPOLAMINE 1 MG/3DAYS TD PT72
MEDICATED_PATCH | TRANSDERMAL | Status: DC | PRN
  Administered 2017-04-17: 1 via TRANSDERMAL

## 2017-04-17 MED ORDER — CIPROFLOXACIN IN D5W 400 MG/200ML IV SOLN
400.0000 mg | Freq: Once | INTRAVENOUS | Status: AC
  Administered 2017-04-17: 400 mg via INTRAVENOUS
  Filled 2017-04-17: qty 200

## 2017-04-17 MED ORDER — ONDANSETRON HCL 4 MG/2ML IJ SOLN
INTRAMUSCULAR | Status: DC | PRN
  Administered 2017-04-17: 4 mg via INTRAVENOUS

## 2017-04-17 MED ORDER — EPHEDRINE SULFATE-NACL 50-0.9 MG/10ML-% IV SOSY
PREFILLED_SYRINGE | INTRAVENOUS | Status: DC | PRN
  Administered 2017-04-17: 10 mg via INTRAVENOUS
  Administered 2017-04-17: 15 mg via INTRAVENOUS

## 2017-04-17 MED ORDER — PROPOFOL 10 MG/ML IV BOLUS
INTRAVENOUS | Status: DC | PRN
  Administered 2017-04-17: 160 mg via INTRAVENOUS

## 2017-04-17 MED ORDER — FENTANYL CITRATE (PF) 100 MCG/2ML IJ SOLN
INTRAMUSCULAR | Status: AC
  Filled 2017-04-17: qty 2

## 2017-04-17 MED ORDER — SCOPOLAMINE 1 MG/3DAYS TD PT72
MEDICATED_PATCH | TRANSDERMAL | Status: AC
  Filled 2017-04-17: qty 1

## 2017-04-17 MED ORDER — ARTIFICIAL TEARS OPHTHALMIC OINT
TOPICAL_OINTMENT | OPHTHALMIC | Status: AC
  Filled 2017-04-17: qty 3.5

## 2017-04-17 MED ORDER — SULFAMETHOXAZOLE-TRIMETHOPRIM 800-160 MG PO TABS: 1.0000 | ORAL_TABLET | Freq: Two times a day (BID) | ORAL | 0 refills | Status: AC

## 2017-04-17 SURGICAL SUPPLY — 27 items
APL SKNCLS STERI-STRIP NONHPOA (GAUZE/BANDAGES/DRESSINGS)
BAG DRAIN URO-CYSTO SKYTR STRL (DRAIN) ×2 IMPLANT
BAG DRN UROCATH (DRAIN) ×1
BASKET STONE 1.7 NGAGE (UROLOGICAL SUPPLIES) IMPLANT
BASKET ZERO TIP NITINOL 2.4FR (BASKET) ×2 IMPLANT
BENZOIN TINCTURE PRP APPL 2/3 (GAUZE/BANDAGES/DRESSINGS) IMPLANT
BSKT STON RTRVL ZERO TP 2.4FR (BASKET) ×1
CATH URET 5FR 28IN OPEN ENDED (CATHETERS) IMPLANT
CLOTH BEACON ORANGE TIMEOUT ST (SAFETY) ×2 IMPLANT
FIBER LASER FLEXIVA 365 (UROLOGICAL SUPPLIES) IMPLANT
FIBER LASER TRAC TIP (UROLOGICAL SUPPLIES) IMPLANT
GLOVE BIO SURGEON STRL SZ7.5 (GLOVE) ×2 IMPLANT
GOWN STRL REUS W/TWL XL LVL3 (GOWN DISPOSABLE) ×2 IMPLANT
GUIDEWIRE ANG ZIPWIRE 038X150 (WIRE) ×2 IMPLANT
GUIDEWIRE STR DUAL SENSOR (WIRE) IMPLANT
INFUSOR MANOMETER BAG 3000ML (MISCELLANEOUS) ×2 IMPLANT
IV NS 1000ML (IV SOLUTION)
IV NS 1000ML BAXH (IV SOLUTION) IMPLANT
IV NS IRRIG 3000ML ARTHROMATIC (IV SOLUTION) ×2 IMPLANT
KIT TURNOVER CYSTO (KITS) ×2 IMPLANT
MANIFOLD NEPTUNE II (INSTRUMENTS) ×2 IMPLANT
NS IRRIG 500ML POUR BTL (IV SOLUTION) ×4 IMPLANT
PACK CYSTO (CUSTOM PROCEDURE TRAY) ×2 IMPLANT
STENT CONTOUR NO GW 8FR 26CM (STENTS) ×2 IMPLANT
STRIP CLOSURE SKIN 1/2X4 (GAUZE/BANDAGES/DRESSINGS) IMPLANT
SYRINGE 10CC LL (SYRINGE) ×2 IMPLANT
TUBE CONNECTING 12X1/4 (SUCTIONS) IMPLANT

## 2017-04-17 NOTE — Discharge Instructions (Signed)

## 2017-04-17 NOTE — Anesthesia Procedure Notes (Signed)
Procedure Name: LMA Insertion Date/Time: 04/17/2017 8:28 AM Performed by: Lyndle Herrlich, MD Pre-anesthesia Checklist: Patient identified, Emergency Drugs available, Suction available and Patient being monitored Patient Re-evaluated:Patient Re-evaluated prior to induction Oxygen Delivery Method: Circle system utilized Preoxygenation: Pre-oxygenation with 100% oxygen Induction Type: IV induction Ventilation: Mask ventilation without difficulty LMA: LMA inserted LMA Size: 4.0 Number of attempts: 1 Airway Equipment and Method: Bite block Placement Confirmation: positive ETCO2 Tube secured with: Tape Dental Injury: Teeth and Oropharynx as per pre-operative assessment

## 2017-04-17 NOTE — Interval H&P Note (Signed)
History and Physical Interval Note:  04/17/2017 8:19 AM  Diane Clark  has presented today for surgery, with the diagnosis of BILATERAL HYDRONEPHROSIS  The various methods of treatment have been discussed with the patient and family. After consideration of risks, benefits and other options for treatment, the patient has consented to  Procedure(s) with comments: CYSTOSCOPY/ RETORGRADE/ STENT EXCHANGE (Bilateral) - ONLY NEEDS 30 MIN as a surgical intervention .  The patient's history has been reviewed, patient examined, no change in status, stable for surgery.  I have reviewed the patient's chart and labs.  Questions were answered to the patient's satisfaction.     [name]

## 2017-04-17 NOTE — Transfer of Care (Signed)
  Last Vitals:  Vitals Value Taken Time  BP    Temp    Pulse 73 04/17/2017  8:55 AM  Resp    SpO2 100 % 04/17/2017  8:55 AM  Vitals shown include unvalidated device data.  Last Pain:  Vitals:   04/17/17 0711  TempSrc:   PainSc: 0-No pain      Patients Stated Pain Goal: 1 (04/17/17 3614)  Immediate Anesthesia Transfer of Care Note  Patient: Diane Clark  Procedure(s) Performed: Procedure(s) (LRB): CYSTOSCOPY/ RETORGRADE/ STENT EXCHANGE (Bilateral)  Patient Location: PACU  Anesthesia Type: General  Level of Consciousness: awake, alert  and oriented  Airway & Oxygen Therapy: Patient Spontanous Breathing and Patient connected to oxygen  Post-op Assessment: Report given to PACU RN and Post -op Vital signs reviewed and stable  Post vital signs: Reviewed and stable  Complications: No apparent anesthesia complications

## 2017-04-17 NOTE — Op Note (Signed)
Operative Note  Preoperative diagnosis:  1.  Bilateral ureteral obstruction secondary to retroperitoneal fibrosis  Postoperative diagnosis: 1.  Same  Procedure(s): 1.  Cystoscopy with bilateral stent exchange 2.  Bilateral retrograde pyelograms with intraoperative interpretation of fluoroscopic imaging  Surgeon: Ellison Hughs, MD  Assistants: None  Anesthesia: General LMA  Complications: None  EBL: Less than 5 mL  Specimens: 1.  Previously placed bilateral JJ stents were removed intact, inspected and discarded  Drains/Catheters: 1.  Bilateral 8 [place] by 26 cm JJ stents without tether  Intraoperative findings:   1.  Bilateral retrograde pyelograms revealed medial deviation and luminal narrowing at the level of L4-L5, bilaterally.  Indication:  Diane Clark is a 57 y.o. female with bilateral ureteral obstruction secondary to retroperitoneal fibrosis.  She is here today for routine stent exchange.  The risks, benefits and alternatives of the above procedures was discussed with the patient preoperatively.  She voices understanding and wishes to proceed.  Description of procedure:  After informed consent was obtained, the patient was brought to the operating room and general LMA anesthesia was administered. The patient was then placed in the dorsolithotomy position and prepped and draped in usual sterile fashion. A timeout was performed. A 23 French rigid cystoscope was then inserted into the urethral meatus and advanced into the bladder under direct vision. A complete bladder survey revealed no intravesical pathology.  Her right JJ stent was then grasped and retracted to the urethral meatus.  A Glidewire was then used to intubate the lumen of the stent and advanced up to the right renal pelvis, under fluoroscopic guidance.  The previously placed stent was then removed over the wire, inspected and discarded.  A 5 French open-ended catheter was then advanced over the wire and  into position within the distal aspects of the right ureter.  A retrograde pyelogram was obtained that showed medial deviation of the right ureter with a 2-3 cm area of luminal narrowing at the level of L4-L5.  The proximal aspects of the ureter above the level of L4-L5 was uniformly dilated.  The right renal pelvis was mild to moderately dilated as well.  The Glidewire was then reinserted through the ureteral catheter and a new [place] stent was placed over the wire and into good position within the right collecting system, confirming placement via fluoroscopy.  A similar maneuver was then carried out on the left side to exchange her 8 [place] JJ stent.  The retrograde pyelogram on the left revealed a similar appearance as the right with medial deviation of the left ureter and a focal area of luminal narrowing at the level of L4-L5.  A new 8 [place] JJ stent was placed over the wire and into good position within the left collecting system, confirming placement via fluoroscopy.  The patient's bladder was then drained.  She tolerated the procedure well and was transferred to the postanesthesia unit in stable condition.  Plan: Repeat CT scan in 3 months with office visit soon after.

## 2017-04-18 ENCOUNTER — Encounter (HOSPITAL_BASED_OUTPATIENT_CLINIC_OR_DEPARTMENT_OTHER): Payer: Self-pay | Admitting: Urology

## 2017-04-19 NOTE — Anesthesia Postprocedure Evaluation (Signed)
Anesthesia Post Note  Patient: Diane Clark  Procedure(s) Performed: CYSTOSCOPY/ RETORGRADE/ STENT EXCHANGE (Bilateral )     Patient location during evaluation: PACU Anesthesia Type: General Level of consciousness: awake and alert Pain management: pain level controlled Vital Signs Assessment: post-procedure vital signs reviewed and stable Respiratory status: spontaneous breathing, nonlabored ventilation, respiratory function stable and patient connected to nasal cannula oxygen Cardiovascular status: blood pressure returned to baseline and stable Postop Assessment: no apparent nausea or vomiting Anesthetic complications: no    Last Vitals:  Vitals:   04/17/17 0916 04/17/17 0952  BP:  (!) 175/89  Pulse: 69   Resp: 11 14  Temp:  36.6 C  SpO2: 98% 100%    Last Pain:  Vitals:   04/17/17 0952  TempSrc: Oral  PainSc: 0-No pain                 Melonee Gerstel EDWARD

## 2017-04-24 ENCOUNTER — Other Ambulatory Visit: Payer: Self-pay | Admitting: Student

## 2017-04-24 DIAGNOSIS — I10 Essential (primary) hypertension: Secondary | ICD-10-CM

## 2017-04-28 ENCOUNTER — Other Ambulatory Visit: Payer: Self-pay | Admitting: Student

## 2017-04-28 DIAGNOSIS — I10 Essential (primary) hypertension: Secondary | ICD-10-CM

## 2017-05-14 DIAGNOSIS — R600 Localized edema: Secondary | ICD-10-CM | POA: Diagnosis not present

## 2017-05-14 DIAGNOSIS — N135 Crossing vessel and stricture of ureter without hydronephrosis: Secondary | ICD-10-CM | POA: Diagnosis not present

## 2017-05-14 DIAGNOSIS — Z960 Presence of urogenital implants: Secondary | ICD-10-CM | POA: Diagnosis not present

## 2017-07-01 ENCOUNTER — Other Ambulatory Visit: Payer: Self-pay | Admitting: Student

## 2017-07-01 DIAGNOSIS — M170 Bilateral primary osteoarthritis of knee: Secondary | ICD-10-CM

## 2017-07-15 DIAGNOSIS — N131 Hydronephrosis with ureteral stricture, not elsewhere classified: Secondary | ICD-10-CM | POA: Diagnosis not present

## 2017-07-23 DIAGNOSIS — N1330 Unspecified hydronephrosis: Secondary | ICD-10-CM | POA: Diagnosis not present

## 2017-07-23 DIAGNOSIS — N131 Hydronephrosis with ureteral stricture, not elsewhere classified: Secondary | ICD-10-CM | POA: Diagnosis not present

## 2017-07-25 DIAGNOSIS — N131 Hydronephrosis with ureteral stricture, not elsewhere classified: Secondary | ICD-10-CM | POA: Diagnosis not present

## 2017-07-25 DIAGNOSIS — K689 Other disorders of retroperitoneum: Secondary | ICD-10-CM | POA: Diagnosis not present

## 2017-07-26 ENCOUNTER — Other Ambulatory Visit: Payer: Self-pay | Admitting: Urology

## 2017-07-29 ENCOUNTER — Other Ambulatory Visit: Payer: Self-pay

## 2017-07-29 DIAGNOSIS — I10 Essential (primary) hypertension: Secondary | ICD-10-CM

## 2017-07-29 MED ORDER — METOPROLOL SUCCINATE ER 50 MG PO TB24: ORAL_TABLET | ORAL | 0 refills | Status: DC

## 2017-08-06 ENCOUNTER — Encounter (HOSPITAL_BASED_OUTPATIENT_CLINIC_OR_DEPARTMENT_OTHER): Payer: Self-pay | Admitting: *Deleted

## 2017-08-07 ENCOUNTER — Other Ambulatory Visit: Payer: Self-pay

## 2017-08-07 ENCOUNTER — Encounter (HOSPITAL_BASED_OUTPATIENT_CLINIC_OR_DEPARTMENT_OTHER): Payer: Self-pay

## 2017-08-07 NOTE — Progress Notes (Signed)
Spoke with:  Peter Congo NPO:  After Midnight, no gum, candy, or mints   Arrival time: 0930AM Labs: Istat8 AM medications:  Metoprolol, Prednisone Pre op orders: Yes Ride home: Chauncey Mann

## 2017-08-08 NOTE — H&P (Signed)
Urology Preoperative H&P   Chief Complaint: Bilateral ureteral obstruction  History of Present Illness: Diane Clark is a 57 y.o. female with retroperitoneal fibrosis resulting in bilateral ureteral obstruction which has required bilateral indwelling ureteral stents.  She is currently on a prednisone regimen, which according to her most recent CT scan, has improved her retroperitoneal disease process.  She is here today for a routine stent exchange.   Past Medical History:  Diagnosis Date  . Cellulitis 03/2017   Right ear  . History of kidney stones   . Hydronephrosis    Bilateral  . Hypertension   . OA (osteoarthritis) rheumotologist-  dr a. Trudie Reed   knees, shoulders  . Obesity   . Seizures ([place])    with fever. Last one age 59  . Ureteral obstruction    bilaterally--- secondary to hx retroperitoneal fibroids-- treatment indwelling ureteral stents  . Uterine fibroid     Past Surgical History:  Procedure Laterality Date  . CERVICAL CONE BIOPSY  1990  . CYSTOSCOPY W/ URETERAL STENT PLACEMENT Bilateral 01/02/2017   Procedure: CYSTOSCOPY WITH STENT REPLACEMENT;  Surgeon: Ceasar Mons, MD;  Location: [facility];  Service: Urology;  Laterality: Bilateral;  ONLY NEEDS 30 MIN  . CYSTOSCOPY WITH RETROGRADE PYELOGRAM, URETEROSCOPY AND STENT PLACEMENT Bilateral 11/09/2016   Procedure: CYSTOSCOPY WITH RETROGRADE PYELOGRAM, BILATERAL URETEROSCOPY,   AND BILATERAL STENT PLACEMENT;  Surgeon: Ceasar Mons, MD;  Location: [facility];  Service: Urology;  Laterality: Bilateral;  . CYSTOSCOPY WITH STENT PLACEMENT Bilateral 04/17/2017   Procedure: CYSTOSCOPY/ RETORGRADE/ STENT EXCHANGE;  Surgeon: Ceasar Mons, MD;  Location: [facility];  Service: Urology;  Laterality: Bilateral;  ONLY NEEDS 30 MIN    Allergies:  Allergies  Allergen Reactions  . Lidocaine Swelling    Pt states " the site where local lidocaine placed-swelling  occurred briefly"    Family History  Problem Relation Age of Onset  . Kidney failure Mother   . Diabetes Mother   . Heart disease Mother   . Kidney failure Sister        kidney transplant  . Hypertension Sister     Social History:  reports that she quit smoking about 30 years ago. Her smoking use included cigarettes. She has a 10.00 pack-year smoking history. She has never used smokeless tobacco. She reports that she drank alcohol. She reports that she does not use drugs.  ROS: A complete review of systems was performed.  All systems are negative except for pertinent findings as noted.  Physical Exam:  Vital signs in last 24 hours:   Constitutional:  Alert and oriented, No acute distress Cardiovascular: Regular rate and rhythm, No JVD Respiratory: Normal respiratory effort, Lungs clear bilaterally GI: Abdomen is soft, nontender, nondistended, no abdominal masses GU: No CVA tenderness Lymphatic: No lymphadenopathy Neurologic: Grossly intact, no focal deficits Psychiatric: Normal mood and affect  Laboratory Data:  No results for input(s): WBC, HGB, HCT, PLT in the last 72 hours.  No results for input(s): NA, K, CL, GLUCOSE, BUN, CALCIUM, CREATININE in the last 72 hours.  Invalid input(s): CO3   No results found for this or any previous visit (from the past 24 hour(s)). No results found for this or any previous visit (from the past 240 hour(s)).  Renal Function: No results for input(s): CREATININE in the last 168 hours. CrCl cannot be calculated (Patient's most recent lab result is older than the maximum 21 days allowed.).  Radiologic Imaging: No results found.  I independently reviewed the above imaging studies.  Assessment and Plan Diane Clark is a 57 y.o. female with retroperitoneal fibrosis resulting in bilateral hydronephrosis  -The risks, benefits and alternatives of bilateral ureteral stent exchange was discussed with the patient.  Risks include, but are  not limited to, bleeding complications, urinary tract infection, pain, urinary symptoms, ureteral injury, ureteral avulsion, MI, CVA, DVT, PE and the inherent risks with general anesthesia.  Ellison Hughs, MD 08/08/2017, [facility] Pager: [phone]

## 2017-08-09 ENCOUNTER — Ambulatory Visit (HOSPITAL_BASED_OUTPATIENT_CLINIC_OR_DEPARTMENT_OTHER): Payer: BLUE CROSS/BLUE SHIELD | Admitting: Certified Registered Nurse Anesthetist

## 2017-08-09 ENCOUNTER — Encounter (HOSPITAL_BASED_OUTPATIENT_CLINIC_OR_DEPARTMENT_OTHER)
Admission: RE | Disposition: A | Discharge status: Home / Self Care | Payer: Self-pay | Source: Ambulatory Visit | Attending: Urology

## 2017-08-09 ENCOUNTER — Ambulatory Visit (HOSPITAL_BASED_OUTPATIENT_CLINIC_OR_DEPARTMENT_OTHER)
Admission: RE | Admit: 2017-08-09 | Discharge: 2017-08-09 | Disposition: A | Discharge status: Home / Self Care | Payer: BLUE CROSS/BLUE SHIELD | Source: Ambulatory Visit | Attending: Urology | Admitting: Urology

## 2017-08-09 ENCOUNTER — Encounter (HOSPITAL_BASED_OUTPATIENT_CLINIC_OR_DEPARTMENT_OTHER): Payer: Self-pay | Admitting: *Deleted

## 2017-08-09 ENCOUNTER — Other Ambulatory Visit: Payer: Self-pay

## 2017-08-09 DIAGNOSIS — I10 Essential (primary) hypertension: Secondary | ICD-10-CM | POA: Insufficient documentation

## 2017-08-09 DIAGNOSIS — N131 Hydronephrosis with ureteral stricture, not elsewhere classified: Secondary | ICD-10-CM | POA: Diagnosis not present

## 2017-08-09 DIAGNOSIS — Z87891 Personal history of nicotine dependence: Secondary | ICD-10-CM | POA: Insufficient documentation

## 2017-08-09 DIAGNOSIS — N135 Crossing vessel and stricture of ureter without hydronephrosis: Secondary | ICD-10-CM | POA: Insufficient documentation

## 2017-08-09 DIAGNOSIS — Z466 Encounter for fitting and adjustment of urinary device: Secondary | ICD-10-CM | POA: Diagnosis not present

## 2017-08-09 HISTORY — DX: Crossing vessel and stricture of ureter without hydronephrosis: N13.5

## 2017-08-09 HISTORY — DX: Leiomyoma of uterus, unspecified: D25.9

## 2017-08-09 HISTORY — PX: CYSTOSCOPY W/ URETERAL STENT PLACEMENT: SHX1429

## 2017-08-09 HISTORY — DX: Cellulitis, unspecified: L03.90

## 2017-08-09 HISTORY — DX: Obesity, unspecified: E66.9

## 2017-08-09 HISTORY — DX: Unspecified osteoarthritis, unspecified site: M19.90

## 2017-08-09 LAB — POCT I-STAT, CHEM 8
BUN: 15 mg/dL (ref 6–20)
Calcium, Ion: 1.26 mmol/L (ref 1.15–1.40)
Chloride: 97 mmol/L — ABNORMAL LOW (ref 98–111)
Creatinine, Ser: 0.6 mg/dL (ref 0.44–1.00)
Glucose, Bld: 77 mg/dL (ref 70–99)
HEMATOCRIT: 38 % (ref 36.0–46.0)
Hemoglobin: 12.9 g/dL (ref 12.0–15.0)
Potassium: 4.6 mmol/L (ref 3.5–5.1)
Sodium: 137 mmol/L (ref 135–145)
TCO2: 32 mmol/L (ref 22–32)

## 2017-08-09 SURGERY — CYSTOSCOPY, FLEXIBLE, WITH STENT REPLACEMENT
Anesthesia: General | Site: Bladder | Laterality: Bilateral

## 2017-08-09 MED ORDER — PHENYLEPHRINE 40 MCG/ML (10ML) SYRINGE FOR IV PUSH (FOR BLOOD PRESSURE SUPPORT)
PREFILLED_SYRINGE | INTRAVENOUS | Status: AC
  Filled 2017-08-09: qty 10

## 2017-08-09 MED ORDER — FENTANYL CITRATE (PF) 100 MCG/2ML IJ SOLN
25.0000 ug | INTRAMUSCULAR | Status: DC | PRN
  Filled 2017-08-09: qty 1

## 2017-08-09 MED ORDER — FENTANYL CITRATE (PF) 100 MCG/2ML IJ SOLN
INTRAMUSCULAR | Status: DC | PRN
  Administered 2017-08-09 (×2): 50 ug via INTRAVENOUS

## 2017-08-09 MED ORDER — DEXAMETHASONE SODIUM PHOSPHATE 10 MG/ML IJ SOLN
INTRAMUSCULAR | Status: AC
  Filled 2017-08-09: qty 1

## 2017-08-09 MED ORDER — FENTANYL CITRATE (PF) 100 MCG/2ML IJ SOLN
INTRAMUSCULAR | Status: AC
  Filled 2017-08-09: qty 2

## 2017-08-09 MED ORDER — DEXAMETHASONE SODIUM PHOSPHATE 10 MG/ML IJ SOLN
INTRAMUSCULAR | Status: DC | PRN
  Administered 2017-08-09: 10 mg via INTRAVENOUS

## 2017-08-09 MED ORDER — ONDANSETRON HCL 4 MG/2ML IJ SOLN
INTRAMUSCULAR | Status: DC | PRN
  Administered 2017-08-09: 4 mg via INTRAVENOUS

## 2017-08-09 MED ORDER — CEFAZOLIN SODIUM-DEXTROSE 2-4 GM/100ML-% IV SOLN
INTRAVENOUS | Status: AC
  Filled 2017-08-09: qty 100

## 2017-08-09 MED ORDER — PROPOFOL 10 MG/ML IV BOLUS
INTRAVENOUS | Status: AC
  Filled 2017-08-09: qty 20

## 2017-08-09 MED ORDER — LIDOCAINE 2% (20 MG/ML) 5 ML SYRINGE
INTRAMUSCULAR | Status: AC
  Filled 2017-08-09: qty 5

## 2017-08-09 MED ORDER — PROMETHAZINE HCL 25 MG/ML IJ SOLN
6.2500 mg | INTRAMUSCULAR | Status: DC | PRN
  Filled 2017-08-09: qty 1

## 2017-08-09 MED ORDER — ONDANSETRON HCL 4 MG/2ML IJ SOLN
INTRAMUSCULAR | Status: AC
  Filled 2017-08-09: qty 2

## 2017-08-09 MED ORDER — HYDROCODONE-ACETAMINOPHEN 5-325 MG PO TABS: 1.0000 | ORAL_TABLET | ORAL | 0 refills | Status: DC | PRN

## 2017-08-09 MED ORDER — LACTATED RINGERS IV SOLN
INTRAVENOUS | Status: DC
  Administered 2017-08-09: 11:00:00 via INTRAVENOUS
  Filled 2017-08-09: qty 1000

## 2017-08-09 MED ORDER — CEFAZOLIN SODIUM-DEXTROSE 2-4 GM/100ML-% IV SOLN
2.0000 g | Freq: Once | INTRAVENOUS | Status: AC
  Administered 2017-08-09: 2 g via INTRAVENOUS
  Filled 2017-08-09: qty 100

## 2017-08-09 MED ORDER — MIDAZOLAM HCL 2 MG/2ML IJ SOLN
INTRAMUSCULAR | Status: DC | PRN
  Administered 2017-08-09: 2 mg via INTRAVENOUS

## 2017-08-09 MED ORDER — IOHEXOL 300 MG/ML  SOLN
INTRAMUSCULAR | Status: DC | PRN
  Administered 2017-08-09: 15 mL via URETHRAL

## 2017-08-09 MED ORDER — PROPOFOL 10 MG/ML IV BOLUS
INTRAVENOUS | Status: DC | PRN
  Administered 2017-08-09: 150 mg via INTRAVENOUS

## 2017-08-09 MED ORDER — PHENYLEPHRINE 40 MCG/ML (10ML) SYRINGE FOR IV PUSH (FOR BLOOD PRESSURE SUPPORT)
PREFILLED_SYRINGE | INTRAVENOUS | Status: DC | PRN
  Administered 2017-08-09: 80 ug via INTRAVENOUS
  Administered 2017-08-09 (×2): 40 ug via INTRAVENOUS

## 2017-08-09 MED ORDER — MIDAZOLAM HCL 2 MG/2ML IJ SOLN
INTRAMUSCULAR | Status: AC
  Filled 2017-08-09: qty 2

## 2017-08-09 SURGICAL SUPPLY — 28 items
APL SKNCLS STERI-STRIP NONHPOA (GAUZE/BANDAGES/DRESSINGS)
BAG DRAIN URO-CYSTO SKYTR STRL (DRAIN) ×2 IMPLANT
BAG DRN UROCATH (DRAIN) ×1
BASKET STONE 1.7 NGAGE (UROLOGICAL SUPPLIES) IMPLANT
BASKET ZERO TIP NITINOL 2.4FR (BASKET) ×2 IMPLANT
BENZOIN TINCTURE PRP APPL 2/3 (GAUZE/BANDAGES/DRESSINGS) IMPLANT
BSKT STON RTRVL ZERO TP 2.4FR (BASKET) ×1
CATH URET 5FR 28IN OPEN ENDED (CATHETERS) IMPLANT
CLOTH BEACON ORANGE TIMEOUT ST (SAFETY) ×2 IMPLANT
FIBER LASER FLEXIVA 365 (UROLOGICAL SUPPLIES) IMPLANT
FIBER LASER TRAC TIP (UROLOGICAL SUPPLIES) IMPLANT
GLOVE BIO SURGEON STRL SZ7.5 (GLOVE) ×2 IMPLANT
GOWN STRL REUS W/TWL XL LVL3 (GOWN DISPOSABLE) ×2 IMPLANT
GUIDEWIRE STR DUAL SENSOR (WIRE) IMPLANT
GUIDEWIRE ZIPWRE .038 STRAIGHT (WIRE) ×3 IMPLANT
INFUSOR MANOMETER BAG 3000ML (MISCELLANEOUS) ×2 IMPLANT
IV NS 1000ML (IV SOLUTION)
IV NS 1000ML BAXH (IV SOLUTION) IMPLANT
IV NS IRRIG 3000ML ARTHROMATIC (IV SOLUTION) ×2 IMPLANT
KIT TURNOVER CYSTO (KITS) ×2 IMPLANT
MANIFOLD NEPTUNE II (INSTRUMENTS) ×2 IMPLANT
NS IRRIG 500ML POUR BTL (IV SOLUTION) ×4 IMPLANT
PACK CYSTO (CUSTOM PROCEDURE TRAY) ×2 IMPLANT
STENT CONTOUR 8FR X 24 (STENTS) ×2 IMPLANT
STRIP CLOSURE SKIN 1/2X4 (GAUZE/BANDAGES/DRESSINGS) IMPLANT
SYR 10ML LL (SYRINGE) ×2 IMPLANT
TUBE CONNECTING 12X1/4 (SUCTIONS) IMPLANT
TUBING UROLOGY SET (TUBING) IMPLANT

## 2017-08-09 NOTE — Anesthesia Preprocedure Evaluation (Signed)
Anesthesia Evaluation  Patient identified by MRN, date of birth, ID band Patient awake    Reviewed: Allergy & Precautions, NPO status , Patient's Chart, lab work & pertinent test results  Airway Mallampati: II  TM Distance: >3 FB Neck ROM: Full    Dental no notable dental hx.    Pulmonary neg pulmonary ROS, former smoker,    Pulmonary exam normal breath sounds clear to auscultation       Cardiovascular hypertension, Normal cardiovascular exam Rhythm:Regular Rate:Normal     Neuro/Psych negative neurological ROS  negative psych ROS   GI/Hepatic negative GI ROS, Neg liver ROS,   Endo/Other  negative endocrine ROS  Renal/GU negative Renal ROS  negative genitourinary   Musculoskeletal negative musculoskeletal ROS (+)   Abdominal   Peds negative pediatric ROS (+)  Hematology negative hematology ROS (+)   Anesthesia Other Findings   Reproductive/Obstetrics negative OB ROS                             Anesthesia Physical Anesthesia Plan  ASA: II  Anesthesia Plan: General   Post-op Pain Management:    Induction: Intravenous  PONV Risk Score and Plan: 3 and Ondansetron, Dexamethasone and Treatment may vary due to age or medical condition  Airway Management Planned: LMA  Additional Equipment:   Intra-op Plan:   Post-operative Plan: Extubation in OR  Informed Consent: I have reviewed the patients History and Physical, chart, labs and discussed the procedure including the risks, benefits and alternatives for the proposed anesthesia with the patient or authorized representative who has indicated his/her understanding and acceptance.   Dental advisory given  Plan Discussed with: CRNA and Surgeon  Anesthesia Plan Comments:         Anesthesia Quick Evaluation

## 2017-08-09 NOTE — Anesthesia Postprocedure Evaluation (Signed)
Anesthesia Post Note  Patient: BENTLI LLORENTE  Procedure(s) Performed: CYSTOSCOPY WITH STENT EXCHANGE/RETROGRADE (Bilateral Bladder)     Patient location during evaluation: PACU Anesthesia Type: General Level of consciousness: awake and alert Pain management: pain level controlled Vital Signs Assessment: post-procedure vital signs reviewed and stable Respiratory status: spontaneous breathing, nonlabored ventilation, respiratory function stable and patient connected to nasal cannula oxygen Cardiovascular status: blood pressure returned to baseline and stable Postop Assessment: no apparent nausea or vomiting Anesthetic complications: no    Last Vitals:  Vitals:   08/09/17 1218 08/09/17 1245  BP: 124/67   Pulse: 67 64  Resp: 12 12  Temp: 36.5 C   SpO2: 100% 100%    Last Pain:  Vitals:   08/09/17 1245  TempSrc:   PainSc: 0-No pain                 Joselinne Lawal S

## 2017-08-09 NOTE — Transfer of Care (Signed)
Immediate Anesthesia Transfer of Care Note  Patient: Diane Clark  Procedure(s) Performed: CYSTOSCOPY WITH STENT EXCHANGE/RETROGRADE (Bilateral Bladder)  Patient Location: PACU  Anesthesia Type:General  Level of Consciousness: awake, alert , oriented, drowsy and patient cooperative  Airway & Oxygen Therapy: Patient Spontanous Breathing and Patient connected to nasal cannula oxygen  Post-op Assessment: Report given to RN and Post -op Vital signs reviewed and stable  Post vital signs: Reviewed and stable  Last Vitals:  Vitals Value Taken Time  BP    Temp    Pulse    Resp    SpO2      Last Pain:  Vitals:   08/09/17 0955  TempSrc:   PainSc: 0-No pain      Patients Stated Pain Goal: 1 (35/70/17 7939)  Complications: No apparent anesthesia complications

## 2017-08-09 NOTE — Anesthesia Procedure Notes (Signed)
Procedure Name: LMA Insertion Date/Time: 08/09/2017 11:44 AM Performed by: Raenette Rover, CRNA Pre-anesthesia Checklist: Patient identified, Emergency Drugs available, Suction available and Patient being monitored Patient Re-evaluated:Patient Re-evaluated prior to induction Oxygen Delivery Method: Circle system utilized Preoxygenation: Pre-oxygenation with 100% oxygen Induction Type: IV induction Ventilation: Mask ventilation without difficulty LMA: LMA inserted LMA Size: 4.0 Number of attempts: 1 Placement Confirmation: positive ETCO2,  CO2 detector and breath sounds checked- equal and bilateral Tube secured with: Tape Dental Injury: Teeth and Oropharynx as per pre-operative assessment

## 2017-08-09 NOTE — Op Note (Signed)
Operative Note  Preoperative diagnosis:  1.  Retroperitoneal fibrosis 2.  Bilateral ureteral obstruction  Postoperative diagnosis: 1.  Retroperitoneal fibrosis 2.  Bilateral ureteral obstruction  Procedure(s): 1.  Cystoscopy with bilateral JJ stent exchange 2.  Bilateral retrograde pyelograms with intraoperative interpretation of fluoroscopic imaging  Surgeon: Ellison Hughs, MD  Assistants: None  Anesthesia: General  Complications: None  EBL: Less than 5 mL  Specimens: 1.  None  Drains/Catheters: 1.  Bilateral 8 [place] by 24 cm JJ stents without tethers  Intraoperative findings:   1. Retrograde pyelograms revealed persistent medial deviation of both ureters, but with markedly improved dilation and tortuosity, bilaterally.  Indication:  Diane Clark is a 57 y.o. female with retroperitoneal fibrosis resulting in bilateral ureteral obstruction requiring indwelling ureteral stents.  She has been consented for the above procedures, voices understanding wishes to proceed.  Description of procedure:  After informed consent was obtained, the patient was brought to the operating room and general LMA anesthesia was administered. The patient was then placed in the dorsolithotomy position and prepped and draped in usual sterile fashion. A timeout was performed. A 23 French rigid cystoscope was then inserted into the urethral meatus and advanced into the bladder under direct vision. A complete bladder survey revealed no intravesical pathology.  Her left JJ stent was then grasped and retracted to the urethral meatus.  A Glidewire was then advanced to the lumen of the stent up to the left renal pelvis, under fluoroscopic guidance.  The left JJ stent was then removed over the wire intact, inspected and discarded.  A 5 French ureteral catheter was then inserted into the left ureteral orifice and a left retrograde pyelogram was obtained that showed medial deviation of the left ureter,  but with markedly improved tortuosity and dilation of the lumen of the left ureter compared to her previous studies.  No filling defects were seen within the left renal pelvis.  A new 8 [place] by 24 cm JJ stent was then advanced over the wire and into good position within the left collecting system, confirming placement via fluoroscopy.  A similar maneuver was then performed on the right side to exchange her stent.  A 5 [place] open-ended catheter was then inserted into the right ureteral orifice and a retrograde pyelogram was obtained, which showed, again, medial deviation of her right ureter, but with improved tortuosity and dilation compared to her previous studies.  No other filling defects were seen within the right renal pelvis.  A new 8 [place] by 24 cm JJ stent was then placed over the wire and into good position within the right collecting system, confirming placement via fluoroscopy.  The patient's bladder was then drained.  She tolerated the procedure well and was transferred to the postanesthesia in stable condition.  Plan: Patient will follow-up in 3 months for possible cystoscopy and JJ stent removal.  She is planning to meet with Dr. Trudie Reed with rheumatology to discuss her anti-inflammatory medication regimen moving forward

## 2017-08-09 NOTE — Discharge Instructions (Signed)
Post Anesthesia Home Care Instructions  Activity: Get plenty of rest for the remainder of the day. A responsible individual must stay with you for 24 hours following the procedure.  For the next 24 hours, DO NOT: -Drive a car -Paediatric nurse -Drink alcoholic beverages -Take any medication unless instructed by your physician -Make any legal decisions or sign important papers.  Meals: Start with liquid foods such as gelatin or soup. Progress to regular foods as tolerated. Avoid greasy, spicy, heavy foods. If nausea and/or vomiting occur, drink only clear liquids until the nausea and/or vomiting subsides. Call your physician if vomiting continues.  Special Instructions/Symptoms: Your throat may feel dry or sore from the anesthesia or the breathing tube placed in your throat during surgery. If this causes discomfort, gargle with warm salt water. The discomfort should disappear within 24 hours.  If you had a scopolamine patch placed behind your ear for the management of post- operative nausea and/or vomiting:  1. The medication in the patch is effective for 72 hours, after which it should be removed.  Wrap patch in a tissue and discard in the trash. Wash hands thoroughly with soap and water. 2. You may remove the patch earlier than 72 hours if you experience unpleasant side effects which may include dry mouth, dizziness or visual disturbances. 3. Avoid touching the patch. Wash your hands with soap and water after contact with the patch.       Cystoscopy, Care After Refer to this sheet in the next few weeks. These instructions provide you with information about caring for yourself after your procedure. Your health care provider may also give you more specific instructions. Your treatment has been planned according to current medical practices, but problems sometimes occur. Call your health care provider if you have any problems or questions after your procedure. What can I expect after  the procedure? After the procedure, it is common to have:  Mild pain when you urinate. Pain should stop within a few minutes after you urinate. This may last for up to 1 week.  A small amount of blood in your urine for several days.  Feeling like you need to urinate but producing only a small amount of urine.  Follow these instructions at home:  Medicines  Take over-the-counter and prescription medicines only as told by your health care provider.  If you were prescribed an antibiotic medicine, take it as told by your health care provider. Do not stop taking the antibiotic even if you start to feel better. General instructions   Return to your normal activities as told by your health care provider. Ask your health care provider what activities are safe for you.  Do not drive for 24 hours if you received a sedative.  Watch for any blood in your urine. If the amount of blood in your urine increases, call your health care provider.  Follow instructions from your health care provider about eating or drinking restrictions.  If a tissue sample was removed for testing (biopsy) during your procedure, it is your responsibility to get your test results. Ask your health care provider or the department performing the test when your results will be ready.  Drink enough fluid to keep your urine clear or pale yellow.  Keep all follow-up visits as told by your health care provider. This is important. Contact a health care provider if:  You have pain that gets worse or does not get better with medicine, especially pain when you urinate.  You have  difficulty urinating. Get help right away if:  You have more blood in your urine.  You have blood clots in your urine.  You have abdominal pain.  You have a fever or chills.  You are unable to urinate. This information is not intended to replace advice given to you by your health care provider. Make sure you discuss any questions you have with  your health care provider. Document Released: 07/21/2004 Document Revised: 06/09/2015 Document Reviewed: 11/18/2014 Elsevier Interactive Patient Education  [employer].

## 2017-08-12 ENCOUNTER — Encounter (HOSPITAL_BASED_OUTPATIENT_CLINIC_OR_DEPARTMENT_OTHER): Payer: Self-pay | Admitting: Urology

## 2017-08-12 DIAGNOSIS — N135 Crossing vessel and stricture of ureter without hydronephrosis: Secondary | ICD-10-CM | POA: Diagnosis not present

## 2017-08-12 DIAGNOSIS — Z960 Presence of urogenital implants: Secondary | ICD-10-CM | POA: Diagnosis not present

## 2017-08-26 ENCOUNTER — Other Ambulatory Visit: Payer: Self-pay | Admitting: Urology

## 2017-08-26 DIAGNOSIS — Z1231 Encounter for screening mammogram for malignant neoplasm of breast: Secondary | ICD-10-CM

## 2017-08-30 ENCOUNTER — Ambulatory Visit
Admission: RE | Admit: 2017-08-30 | Discharge: 2017-08-30 | Disposition: A | Discharge status: Home / Self Care | Payer: BLUE CROSS/BLUE SHIELD | Source: Ambulatory Visit | Attending: Urology | Admitting: Urology

## 2017-08-30 DIAGNOSIS — Z1231 Encounter for screening mammogram for malignant neoplasm of breast: Secondary | ICD-10-CM

## 2017-09-09 ENCOUNTER — Ambulatory Visit (INDEPENDENT_AMBULATORY_CARE_PROVIDER_SITE_OTHER): Payer: BLUE CROSS/BLUE SHIELD | Admitting: Family Medicine

## 2017-09-09 ENCOUNTER — Encounter: Payer: Self-pay | Admitting: Family Medicine

## 2017-09-09 ENCOUNTER — Other Ambulatory Visit: Payer: Self-pay

## 2017-09-09 VITALS — BP 108/70 | HR 78 | Temp 98.5°F | Ht 68.0 in | Wt 194.0 lb

## 2017-09-09 DIAGNOSIS — Z1211 Encounter for screening for malignant neoplasm of colon: Secondary | ICD-10-CM

## 2017-09-09 DIAGNOSIS — I10 Essential (primary) hypertension: Secondary | ICD-10-CM

## 2017-09-09 DIAGNOSIS — Z23 Encounter for immunization: Secondary | ICD-10-CM

## 2017-09-09 DIAGNOSIS — Z0000 Encounter for general adult medical examination without abnormal findings: Secondary | ICD-10-CM

## 2017-09-09 DIAGNOSIS — M170 Bilateral primary osteoarthritis of knee: Secondary | ICD-10-CM | POA: Diagnosis not present

## 2017-09-09 MED ORDER — CARVEDILOL 12.5 MG PO TABS: 12.5000 mg | ORAL_TABLET | Freq: Two times a day (BID) | ORAL | 3 refills | Status: DC

## 2017-09-09 MED ORDER — DULOXETINE HCL 60 MG PO CPEP: ORAL_CAPSULE | ORAL | 0 refills | Status: DC

## 2017-09-09 MED ORDER — LISINOPRIL 40 MG PO TABS: 40.0000 mg | ORAL_TABLET | Freq: Every day | ORAL | 3 refills | Status: DC

## 2017-09-09 NOTE — Patient Instructions (Signed)
It was nice meeting you today Diane Clark!  We are starting carvedilol (Coreg) in the place of metoprolol for your hypertension.  Please take this pill twice per day.  Please also bring your stool cards back to clinic when you are ready.  I would like to see you back in about one year.  If you have any questions or concerns, please feel free to call the clinic.   Be well,  Dr. Shan Levans

## 2017-09-09 NOTE — Progress Notes (Signed)
   Subjective:    Diane Clark - 57 y.o. female MRN [id number]  Date of birth: 1960/12/02  HPI  Diane Clark is here for follow up.    She is getting frequent ureteral stent changes due to her bilateral ureteral obstruction and is tapering off of prednisone.  She notes that she has gained some weight while on prednisone, but urination has been normal.  She was started on a new medication to help reduce inflammation and does not know what it is called.  She is also taking Fe to help that medication stay in her system.  Hypertension Patient would like refills of her lisinopril and metoprolol.  She is not experiencing any side effects from these medications.  Osteoarthritis Patient would like refills on her Cymbalta  Health Maintenance:  -Would prefer to do annual FOBT cards rather than a colonoscopy - Would like the flu shot today Health Maintenance Due  Topic Date Due  . COLONOSCOPY  06/01/2008  . COLON CANCER SCREENING ANNUAL FOBT  05/12/2016    -  reports that she quit smoking about 30 years ago. Her smoking use included cigarettes. She has a 10.00 pack-year smoking history. She has never used smokeless tobacco. - Review of Systems: Per HPI. - Past Medical History: Patient Active Problem List   Diagnosis Date Noted  . 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 Chronic fatigue 01/25/2017  . Bilateral ureteral obstruction 11/09/2016  . Abnormal finding on imaging 10/26/2016  . Osteoarthritis of knees, bilateral 07/29/2015  . Routine health maintenance 04/19/2015  . Physical exam, routine 03/21/2010  . OBESITY, NOS 03/14/2006  . HYPERTENSION, BENIGN SYSTEMIC 03/14/2006   - Medications: reviewed and updated   Objective:   Physical Exam BP 108/70   Pulse 78   Temp 98.5 F (36.9 C) (Oral)   Ht 5\' 8"  (1.727 m)   Wt 194 lb (88 kg)   LMP 03/08/2010   SpO2 98%   BMI 29.50 kg/m  Gen: NAD, alert, cooperative with exam, well-appearing CV: RRR, good S1/S2, no murmur, no  edema Resp: CTABL, no wheezes, non-labored Abd: SNTND, BS present, no guarding or organomegaly  Psych: good insight, alert and oriented  Assessment & Plan:   HYPERTENSION, BENIGN SYSTEMIC Well-controlled today.  Will refill lisinopril and switch metoprolol to carvedilol given carvedilol's superior side effect profile.  Patient is amenable to this change.  Osteoarthritis of knees, bilateral Will refill patient's Cymbalta since this is working well for her.  Also is likely beneficial for chronic fatigue.  Routine health maintenance Reviewed the superiority of a colonoscopy in detecting colon cancer early and discussed the fact that she would only need to do this once every 10 years if it is normal.  Patient still prefers the FOBT option.  Will put in order and give patient FOBT cards today.  Flu shot given.    Maia Breslow, M.D. 09/11/2017, 10:34 AM PGY-2, [place]

## 2017-09-11 DIAGNOSIS — N135 Crossing vessel and stricture of ureter without hydronephrosis: Secondary | ICD-10-CM | POA: Diagnosis not present

## 2017-09-11 NOTE — Assessment & Plan Note (Addendum)
Reviewed the superiority of a colonoscopy in detecting colon cancer early and discussed the fact that she would only need to do this once every 10 years if it is normal.  Patient still prefers the FOBT option.  Will put in order and give patient FOBT cards today.  Flu shot given.

## 2017-09-11 NOTE — Assessment & Plan Note (Signed)
Well-controlled today.  Will refill lisinopril and switch metoprolol to carvedilol given carvedilol's superior side effect profile.  Patient is amenable to this change.

## 2017-09-11 NOTE — Assessment & Plan Note (Signed)
Will refill patient's Cymbalta since this is working well for her.  Also is likely beneficial for chronic fatigue.

## 2017-10-14 DIAGNOSIS — N135 Crossing vessel and stricture of ureter without hydronephrosis: Secondary | ICD-10-CM | POA: Diagnosis not present

## 2017-10-25 DIAGNOSIS — Z960 Presence of urogenital implants: Secondary | ICD-10-CM | POA: Diagnosis not present

## 2017-10-25 DIAGNOSIS — N135 Crossing vessel and stricture of ureter without hydronephrosis: Secondary | ICD-10-CM | POA: Diagnosis not present

## 2017-10-25 DIAGNOSIS — Z79899 Other long term (current) drug therapy: Secondary | ICD-10-CM | POA: Diagnosis not present

## 2017-11-01 ENCOUNTER — Other Ambulatory Visit: Payer: Self-pay | Admitting: Family Medicine

## 2017-11-01 DIAGNOSIS — I10 Essential (primary) hypertension: Secondary | ICD-10-CM

## 2017-11-08 DIAGNOSIS — N131 Hydronephrosis with ureteral stricture, not elsewhere classified: Secondary | ICD-10-CM | POA: Diagnosis not present

## 2017-11-08 DIAGNOSIS — K689 Other disorders of retroperitoneum: Secondary | ICD-10-CM | POA: Diagnosis not present

## 2017-11-14 ENCOUNTER — Other Ambulatory Visit: Payer: Self-pay | Admitting: Urology

## 2017-11-18 ENCOUNTER — Encounter (HOSPITAL_BASED_OUTPATIENT_CLINIC_OR_DEPARTMENT_OTHER): Payer: Self-pay | Admitting: *Deleted

## 2017-11-18 ENCOUNTER — Other Ambulatory Visit: Payer: Self-pay

## 2017-11-18 NOTE — Progress Notes (Signed)
Spoke with Annamaria npo after midnight arrive 1030am 11-22-17 [facility] meds to take sip of water:carvedilol, tylenol prn, prednisone ekg 04-17-17 chart/epic Driver ron gore friend cell [phone] Needs istat 4 Surgery orders in epic

## 2017-11-22 ENCOUNTER — Ambulatory Visit (HOSPITAL_BASED_OUTPATIENT_CLINIC_OR_DEPARTMENT_OTHER): Payer: BLUE CROSS/BLUE SHIELD | Admitting: Anesthesiology

## 2017-11-22 ENCOUNTER — Encounter (HOSPITAL_BASED_OUTPATIENT_CLINIC_OR_DEPARTMENT_OTHER)
Admission: RE | Disposition: A | Discharge status: Home / Self Care | Payer: Self-pay | Source: Ambulatory Visit | Attending: Urology

## 2017-11-22 ENCOUNTER — Ambulatory Visit (HOSPITAL_BASED_OUTPATIENT_CLINIC_OR_DEPARTMENT_OTHER)
Admission: RE | Admit: 2017-11-22 | Discharge: 2017-11-22 | Disposition: A | Discharge status: Home / Self Care | Payer: BLUE CROSS/BLUE SHIELD | Source: Ambulatory Visit | Attending: Urology | Admitting: Urology

## 2017-11-22 ENCOUNTER — Encounter (HOSPITAL_BASED_OUTPATIENT_CLINIC_OR_DEPARTMENT_OTHER): Payer: Self-pay | Admitting: *Deleted

## 2017-11-22 DIAGNOSIS — Z79899 Other long term (current) drug therapy: Secondary | ICD-10-CM | POA: Diagnosis not present

## 2017-11-22 DIAGNOSIS — Z6829 Body mass index (BMI) 29.0-29.9, adult: Secondary | ICD-10-CM | POA: Diagnosis not present

## 2017-11-22 DIAGNOSIS — N131 Hydronephrosis with ureteral stricture, not elsewhere classified: Secondary | ICD-10-CM | POA: Diagnosis not present

## 2017-11-22 DIAGNOSIS — M1990 Unspecified osteoarthritis, unspecified site: Secondary | ICD-10-CM | POA: Insufficient documentation

## 2017-11-22 DIAGNOSIS — Z87891 Personal history of nicotine dependence: Secondary | ICD-10-CM | POA: Diagnosis not present

## 2017-11-22 DIAGNOSIS — I10 Essential (primary) hypertension: Secondary | ICD-10-CM | POA: Diagnosis not present

## 2017-11-22 DIAGNOSIS — E669 Obesity, unspecified: Secondary | ICD-10-CM | POA: Diagnosis not present

## 2017-11-22 DIAGNOSIS — N135 Crossing vessel and stricture of ureter without hydronephrosis: Secondary | ICD-10-CM | POA: Diagnosis not present

## 2017-11-22 DIAGNOSIS — Z466 Encounter for fitting and adjustment of urinary device: Secondary | ICD-10-CM | POA: Diagnosis not present

## 2017-11-22 HISTORY — PX: CYSTOSCOPY W/ URETERAL STENT PLACEMENT: SHX1429

## 2017-11-22 LAB — POCT I-STAT 4, (NA,K, GLUC, HGB,HCT)
Glucose, Bld: 91 mg/dL (ref 70–99)
HEMATOCRIT: 33 % — AB (ref 36.0–46.0)
HEMOGLOBIN: 11.2 g/dL — AB (ref 12.0–15.0)
POTASSIUM: 3.8 mmol/L (ref 3.5–5.1)
SODIUM: 143 mmol/L (ref 135–145)

## 2017-11-22 SURGERY — CYSTOSCOPY, WITH RETROGRADE PYELOGRAM AND URETERAL STENT INSERTION
Anesthesia: General | Site: Renal | Laterality: Bilateral

## 2017-11-22 MED ORDER — IOHEXOL 300 MG/ML  SOLN
INTRAMUSCULAR | Status: DC | PRN
  Administered 2017-11-22: 46 mL via INTRAVENOUS

## 2017-11-22 MED ORDER — HYDROCODONE-ACETAMINOPHEN 5-325 MG PO TABS: 1.0000 | ORAL_TABLET | ORAL | 0 refills | Status: DC | PRN

## 2017-11-22 MED ORDER — CEFAZOLIN SODIUM-DEXTROSE 2-4 GM/100ML-% IV SOLN
2.0000 g | Freq: Once | INTRAVENOUS | Status: AC
  Administered 2017-11-22: 2 g via INTRAVENOUS
  Filled 2017-11-22: qty 100

## 2017-11-22 MED ORDER — FENTANYL CITRATE (PF) 100 MCG/2ML IJ SOLN
INTRAMUSCULAR | Status: AC
  Filled 2017-11-22: qty 2

## 2017-11-22 MED ORDER — CEFAZOLIN SODIUM-DEXTROSE 2-4 GM/100ML-% IV SOLN
INTRAVENOUS | Status: AC
  Filled 2017-11-22: qty 100

## 2017-11-22 MED ORDER — SODIUM CHLORIDE 0.9 % IR SOLN
Status: DC | PRN
  Administered 2017-11-22: 3000 mL via INTRAVESICAL

## 2017-11-22 MED ORDER — KETOROLAC TROMETHAMINE 30 MG/ML IJ SOLN
INTRAMUSCULAR | Status: AC
  Filled 2017-11-22: qty 1

## 2017-11-22 MED ORDER — FENTANYL CITRATE (PF) 100 MCG/2ML IJ SOLN
INTRAMUSCULAR | Status: DC | PRN
  Administered 2017-11-22: 25 ug via INTRAVENOUS
  Administered 2017-11-22: 50 ug via INTRAVENOUS
  Administered 2017-11-22: 25 ug via INTRAVENOUS

## 2017-11-22 MED ORDER — ONDANSETRON HCL 4 MG PO TABS: 4.0000 mg | ORAL_TABLET | Freq: Every day | ORAL | 1 refills | Status: AC | PRN

## 2017-11-22 MED ORDER — SCOPOLAMINE 1 MG/3DAYS TD PT72
1.0000 | MEDICATED_PATCH | TRANSDERMAL | Status: DC
  Administered 2017-11-22: 1.5 mg via TRANSDERMAL
  Filled 2017-11-22: qty 1

## 2017-11-22 MED ORDER — PROPOFOL 10 MG/ML IV BOLUS
INTRAVENOUS | Status: AC
  Filled 2017-11-22: qty 20

## 2017-11-22 MED ORDER — LACTATED RINGERS IV SOLN
INTRAVENOUS | Status: DC
  Administered 2017-11-22: 11:00:00 via INTRAVENOUS
  Filled 2017-11-22: qty 1000

## 2017-11-22 MED ORDER — CEPHALEXIN 500 MG PO CAPS: 500.0000 mg | ORAL_CAPSULE | Freq: Two times a day (BID) | ORAL | 0 refills | Status: AC

## 2017-11-22 MED ORDER — MIDAZOLAM HCL 2 MG/2ML IJ SOLN
INTRAMUSCULAR | Status: AC
  Filled 2017-11-22: qty 2

## 2017-11-22 MED ORDER — MIDAZOLAM HCL 5 MG/5ML IJ SOLN
INTRAMUSCULAR | Status: DC | PRN
  Administered 2017-11-22: 2 mg via INTRAVENOUS

## 2017-11-22 MED ORDER — FENTANYL CITRATE (PF) 100 MCG/2ML IJ SOLN
25.0000 ug | INTRAMUSCULAR | Status: DC | PRN
  Filled 2017-11-22: qty 1

## 2017-11-22 MED ORDER — DEXAMETHASONE SODIUM PHOSPHATE 10 MG/ML IJ SOLN
INTRAMUSCULAR | Status: DC | PRN
  Administered 2017-11-22: 10 mg via INTRAVENOUS

## 2017-11-22 MED ORDER — ONDANSETRON HCL 4 MG/2ML IJ SOLN
INTRAMUSCULAR | Status: DC | PRN
  Administered 2017-11-22: 4 mg via INTRAVENOUS

## 2017-11-22 MED ORDER — PROPOFOL 10 MG/ML IV BOLUS
INTRAVENOUS | Status: DC | PRN
  Administered 2017-11-22: 150 mg via INTRAVENOUS

## 2017-11-22 MED ORDER — ONDANSETRON HCL 4 MG/2ML IJ SOLN
INTRAMUSCULAR | Status: AC
  Filled 2017-11-22: qty 2

## 2017-11-22 MED ORDER — DEXAMETHASONE SODIUM PHOSPHATE 10 MG/ML IJ SOLN
INTRAMUSCULAR | Status: AC
  Filled 2017-11-22: qty 1

## 2017-11-22 MED ORDER — KETOROLAC TROMETHAMINE 30 MG/ML IJ SOLN
INTRAMUSCULAR | Status: DC | PRN
  Administered 2017-11-22: 30 mg via INTRAVENOUS

## 2017-11-22 MED ORDER — SCOPOLAMINE 1 MG/3DAYS TD PT72
MEDICATED_PATCH | TRANSDERMAL | Status: AC
  Filled 2017-11-22: qty 1

## 2017-11-22 SURGICAL SUPPLY — 32 items
APL SKNCLS STERI-STRIP NONHPOA (GAUZE/BANDAGES/DRESSINGS)
BAG DRAIN URO-CYSTO SKYTR STRL (DRAIN) ×2 IMPLANT
BAG DRN UROCATH (DRAIN) ×1
BASKET STONE 1.7 NGAGE (UROLOGICAL SUPPLIES) IMPLANT
BASKET ZERO TIP NITINOL 2.4FR (BASKET) ×1 IMPLANT
BENZOIN TINCTURE PRP APPL 2/3 (GAUZE/BANDAGES/DRESSINGS) IMPLANT
BSKT STON RTRVL ZERO TP 2.4FR (BASKET)
CATH URET 5FR 28IN OPEN ENDED (CATHETERS) ×1 IMPLANT
CLOTH BEACON ORANGE TIMEOUT ST (SAFETY) ×2 IMPLANT
FIBER LASER FLEXIVA 365 (UROLOGICAL SUPPLIES) IMPLANT
FIBER LASER TRAC TIP (UROLOGICAL SUPPLIES) IMPLANT
GLOVE BIO SURGEON STRL SZ7.5 (GLOVE) ×2 IMPLANT
GLOVE BIOGEL PI IND STRL 6.5 (GLOVE) IMPLANT
GLOVE BIOGEL PI INDICATOR 6.5 (GLOVE) ×1
GLOVE SURG SS PI 6.5 STRL IVOR (GLOVE) ×1 IMPLANT
GOWN STRL REUS W/ TWL LRG LVL3 (GOWN DISPOSABLE) IMPLANT
GOWN STRL REUS W/TWL LRG LVL3 (GOWN DISPOSABLE) ×2
GOWN STRL REUS W/TWL XL LVL3 (GOWN DISPOSABLE) ×2 IMPLANT
GUIDEWIRE STR DUAL SENSOR (WIRE) IMPLANT
GUIDEWIRE ZIPWRE .038 STRAIGHT (WIRE) ×2 IMPLANT
IV NS 1000ML (IV SOLUTION)
IV NS 1000ML BAXH (IV SOLUTION) IMPLANT
IV NS IRRIG 3000ML ARTHROMATIC (IV SOLUTION) ×2 IMPLANT
KIT TURNOVER CYSTO (KITS) ×2 IMPLANT
MANIFOLD NEPTUNE II (INSTRUMENTS) ×2 IMPLANT
NS IRRIG 500ML POUR BTL (IV SOLUTION) ×2 IMPLANT
PACK CYSTO (CUSTOM PROCEDURE TRAY) ×2 IMPLANT
STENT CONTOUR 8FR X 24 (STENTS) ×2 IMPLANT
STRIP CLOSURE SKIN 1/2X4 (GAUZE/BANDAGES/DRESSINGS) IMPLANT
SYR 10ML LL (SYRINGE) ×2 IMPLANT
TUBE CONNECTING 12X1/4 (SUCTIONS) IMPLANT
TUBING UROLOGY SET (TUBING) ×2 IMPLANT

## 2017-11-22 NOTE — Anesthesia Procedure Notes (Signed)
Procedure Name: LMA Insertion Date/Time: 11/22/2017 12:26 PM Performed by: Justice Rocher, CRNA Pre-anesthesia Checklist: Patient identified, Emergency Drugs available, Suction available and Patient being monitored Patient Re-evaluated:Patient Re-evaluated prior to induction Oxygen Delivery Method: Circle system utilized Preoxygenation: Pre-oxygenation with 100% oxygen Induction Type: IV induction Ventilation: Mask ventilation without difficulty LMA: LMA inserted LMA Size: 4.0 Number of attempts: 1 Airway Equipment and Method: Bite block Placement Confirmation: positive ETCO2 and breath sounds checked- equal and bilateral Tube secured with: Tape Dental Injury: Teeth and Oropharynx as per pre-operative assessment

## 2017-11-22 NOTE — H&P (Signed)
Urology Preoperative H&P   Chief Complaint: Ureteral obstruction  History of Present Illness: Diane Clark is a 57 y.o. 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 She is also being followed by Dr. Gavin Pound with [facility] and was started on a Prednisone steroid regimen to help treat her RPF (currently tapered down to 2.5 mg and 5 mg of prednisone on alternating days along with methotrexate).   Last Stent Exchange-04/17/17 Last creatinine- 0.5 (07/2017)   Today, Diane Clark reports occasional flank pain and bladder irritation, which has remained stable since her stents were placed and are not bothersome.    Past Medical History:  Diagnosis Date  . Cellulitis 03/2017   Right ear  . Hydronephrosis    Bilateral  . Hypertension   . OA (osteoarthritis) rheumotologist-  dr a. Trudie Reed   knees, shoulders  . Obesity   . Seizures ([place])    with fever. Last one age 48  . Ureteral obstruction    bilaterally--- secondary to hx retroperitoneal fibroids-- treatment indwelling ureteral stents  . Uterine fibroid     Past Surgical History:  Procedure Laterality Date  . CERVICAL CONE BIOPSY  1990  . CYSTOSCOPY W/ URETERAL STENT PLACEMENT Bilateral 01/02/2017   Procedure: CYSTOSCOPY WITH STENT REPLACEMENT;  Surgeon: Ceasar Mons, MD;  Location: [facility];  Service: Urology;  Laterality: Bilateral;  ONLY NEEDS 30 MIN  . CYSTOSCOPY W/ URETERAL STENT PLACEMENT Bilateral 08/09/2017   Procedure: CYSTOSCOPY WITH Aura Dials;  Surgeon: Ceasar Mons, MD;  Location: [facility];  Service: Urology;  Laterality: Bilateral;  ONLY NEEDS 30 MIN  . CYSTOSCOPY WITH RETROGRADE PYELOGRAM, URETEROSCOPY AND STENT PLACEMENT Bilateral 11/09/2016   Procedure: CYSTOSCOPY WITH RETROGRADE PYELOGRAM, BILATERAL URETEROSCOPY,   AND BILATERAL STENT  PLACEMENT;  Surgeon: Ceasar Mons, MD;  Location: [facility];  Service: Urology;  Laterality: Bilateral;  . CYSTOSCOPY WITH STENT PLACEMENT Bilateral 04/17/2017   Procedure: CYSTOSCOPY/ RETORGRADE/ STENT EXCHANGE;  Surgeon: Ceasar Mons, MD;  Location: [facility];  Service: Urology;  Laterality: Bilateral;  ONLY NEEDS 30 MIN    Allergies:  Allergies  Allergen Reactions  . Lidocaine Swelling    Pt states " the site where local lidocaine placed-swelling occurred briefly"    Family History  Problem Relation Age of Onset  . Kidney failure Mother   . Diabetes Mother   . Heart disease Mother   . Kidney failure Sister        kidney transplant  . Hypertension Sister     Social History:  reports that she quit smoking about 30 years ago. Her smoking use included cigarettes. She has a 10.00 pack-year smoking history. She has never used smokeless tobacco. She reports that she drinks alcohol. She reports that she does not use drugs.  ROS: A complete review of systems was performed.  All systems are negative except for pertinent findings as noted.  Physical Exam:  Vital signs in last 24 hours: Temp:  [97.6 F (36.4 C)] 97.6 F (36.4 C) (11/08 1044) Pulse Rate:  [69] 69 (11/08 1044) Resp:  [16] 16 (11/08 1044) BP: (125)/(70) 125/70 (11/08 1044) SpO2:  [100 %] 100 % (11/08 1044) Weight:  [90.6 kg] 90.6 kg (11/08 1044) Constitutional:  Alert and oriented, No acute distress Cardiovascular: Regular rate and rhythm, No JVD Respiratory: Normal respiratory effort, Lungs clear bilaterally GI: Abdomen is soft, nontender, nondistended, no abdominal masses GU: No  CVA tenderness Lymphatic: No lymphadenopathy Neurologic: Grossly intact, no focal deficits Psychiatric: Normal mood and affect  Laboratory Data:  Recent Labs    11/22/17 1109  HGB 11.2*  HCT 33.0*    Recent Labs    11/22/17 1109  NA 143  K 3.8  GLUCOSE 91     Results for orders placed  or performed during the hospital encounter of 11/22/17 (from the past 24 hour(s))  I-STAT 4, (NA,K, GLUC, HGB,HCT)     Status: Abnormal   Collection Time: 11/22/17 11:09 AM  Result Value Ref Range   Sodium 143 135 - 145 mmol/L   Potassium 3.8 3.5 - 5.1 mmol/L   Glucose, Bld 91 70 - 99 mg/dL   HCT 33.0 (L) 36.0 - 46.0 %   Hemoglobin 11.2 (L) 12.0 - 15.0 g/dL   No results found for this or any previous visit (from the past 240 hour(s)).  Renal Function: No results for input(s): CREATININE in the last 168 hours. CrCl cannot be calculated (Patient's most recent lab result is older than the maximum 21 days allowed.).  Radiologic Imaging: No results found.  I independently reviewed the above imaging studies.  Assessment and Plan ANALYS [name] is a 57 y.o. female with bilateral ureteral obstruction 2/2 retroperitoneal fibrosis  -I had a long discussion with the patient about removing her stents and assessing her symptoms as well as her renal function after stent removal. My hope is that her retroperitoneal fibrosis will be diminished enough to allow her to go without her one or both of her indwelling stents. We discussed the risks of cystoscopy with bilateral JJ stent removal and bilateral retrograde pyelograms. Risks included, but are not limited to UTI, bleeding, ureteral obstruction requiring stent replacement, flank pain, MI, CVA, PE, DVT and the inherent risks of general anesthesia. She voices understanding and wishes to proceed.     Ellison Hughs, MD 11/22/2017, 11:47 AM  [facility] Pager: [phone]

## 2017-11-22 NOTE — Anesthesia Preprocedure Evaluation (Addendum)
Anesthesia Evaluation  Patient identified by MRN, date of birth, ID band Patient awake    Reviewed: Allergy & Precautions, NPO status , Patient's Chart, lab work & pertinent test results, reviewed documented beta blocker date and time   Airway Mallampati: III  TM Distance: >3 FB Neck ROM: Full    Dental no notable dental hx. (+) Teeth Intact, Dental Advisory Given   Pulmonary neg pulmonary ROS, former smoker,    Pulmonary exam normal breath sounds clear to auscultation       Cardiovascular hypertension, Pt. on home beta blockers and Pt. on medications negative cardio ROS Normal cardiovascular exam Rhythm:Regular Rate:Normal     Neuro/Psych Seizures - (febrile seizures, now resolved), Well Controlled,  negative psych ROS   GI/Hepatic negative GI ROS, Neg liver ROS,   Endo/Other  negative endocrine ROS  Renal/GU negative Renal ROS  negative genitourinary   Musculoskeletal  (+) Arthritis , Rheumatoid disorders,    Abdominal   Peds  Hematology negative hematology ROS (+)   Anesthesia Other Findings B/l ureteral obstruction 2/2 uterine fibroids  On prednisone for the last year, now on taper, current dose 2.5mg  QD  Reproductive/Obstetrics                            Anesthesia Physical Anesthesia Plan  ASA: III  Anesthesia Plan: General   Post-op Pain Management:    Induction: Intravenous  PONV Risk Score and Plan: 3 and Midazolam, Dexamethasone and Ondansetron  Airway Management Planned: LMA and Oral ETT  Additional Equipment:   Intra-op Plan:   Post-operative Plan: Extubation in OR  Informed Consent: I have reviewed the patients History and Physical, chart, labs and discussed the procedure including the risks, benefits and alternatives for the proposed anesthesia with the patient or authorized representative who has indicated his/her understanding and acceptance.   Dental advisory  given  Plan Discussed with: CRNA  Anesthesia Plan Comments:         Anesthesia Quick Evaluation

## 2017-11-22 NOTE — Anesthesia Postprocedure Evaluation (Signed)
Anesthesia Post Note  Patient: Diane Clark  Procedure(s) Performed: CYSTOSCOPY WITH RETROGRADE PYELOGRAM/URETERAL STENT PLACEMENT (Bilateral Renal)     Patient location during evaluation: PACU Anesthesia Type: General Level of consciousness: awake and alert Pain management: pain level controlled Vital Signs Assessment: post-procedure vital signs reviewed and stable Respiratory status: spontaneous breathing, nonlabored ventilation, respiratory function stable and patient connected to nasal cannula oxygen Cardiovascular status: blood pressure returned to baseline and stable Postop Assessment: no apparent nausea or vomiting Anesthetic complications: no    Last Vitals:  Vitals:   11/22/17 1345 11/22/17 1415  BP: 116/76 140/81  Pulse: 69 71  Resp: 14 16  Temp:  36.5 C  SpO2: 97% 100%    Last Pain:  Vitals:   11/22/17 1415  TempSrc:   PainSc: 0-No pain                 Beyonka Pitney L Azeem Poorman

## 2017-11-22 NOTE — Op Note (Signed)
Operative Note  Preoperative diagnosis:  1.  Retroperitoneal fibrosis 2.  Bilateral ureteral obstruction at the level of L5-S1  Postoperative diagnosis: 1.  Same  Procedure(s): 1.  Cystoscopy with bilateral JJ stent exchange 2.  Bilateral retrograde pyelograms with intraoperative interpretation of fluoroscopic imaging  Surgeon: Ellison Hughs, MD  Assistants:  None  Anesthesia:  General  Complications:  None  EBL: Less than 5 mL  Specimens: 1.  Previously placed bilateral JJ stents were removed intact, inspected and discarded  Drains/Catheters: 1.  Bilateral 8 [place] by 24 cm JJ stents without tethers  Intraoperative findings:   1. High-grade obstruction involving both ureters at the level of L5-S1.  Contrast would not bypass the area of stenosis at that level.  She has persistent medial deviation of both ureters, with the right ureter being more deviated than the left.  Retrograde pyelograms were obtained beyond the area of stenosis in the more proximal ureter/renal pelvis was observed for several minutes on each side with no passage of contrast beyond the area of stenosis at the L5-S1 region.  Due to the high-grade obstruction in both of her ureters, ureteral stents were replaced.  Indication:  Diane Clark is a 57 y.o. female with a history of retroperitoneal fibrosis currently treated with prednisone and methotrexate as well as bilateral ureteral obstruction requiring indwelling ureteral stents.  She has been consented for the above procedures, voices understanding and wishes to proceed.  Description of procedure:  After informed consent was obtained, the patient was brought to the operating room and general LMA anesthesia was administered. The patient was then placed in the dorsolithotomy position and prepped and draped in usual sterile fashion. A timeout was performed. A 23 French rigid cystoscope was then inserted into the urethral meatus and advanced into the  bladder under direct vision. A complete bladder survey revealed no intravesical pathology.  Her previously placed left JJ stent was grasped at its distal curl, removed intact, inspected and discarded.  A 5 French ureteral catheter was then inserted into the distal aspects of the left ureter and a left retrograde pyelogram was obtained, with the findings listed above.  There is a high-grade obstruction at the level of L5-S1 with medial deviation of her ureter.  Contrast would not bypass the level of ureteral obstruction to outline the more proximal upper urinary tracts.  The Glidewire was then reinserted through the ureteral catheter and advanced up to the left renal pelvis, under fluoroscopic guidance.  The ureteral catheter was then advanced beyond the area of stenosis within the left ureter and contrast was injected into the left renal pelvis.  The ureteral catheter was then removed completely in the left renal pelvis was observed for several minutes.  With no evidence of contrast bypassing the area of stenosis at L5-S1.  I then removed her right JJ stent in a similar fashion.  A retrograde pyelogram was obtained, with the findings listed above.  I again injected contrast beyond the area of stenosis, with the findings listed above.  I watched both upper tracts for several minutes and contrast was retained in the more proximal ureter/renal pelvis and would not bypass the area of stenosis at L5-S1, bilaterally.  I then made the decision to replace her JJ stents.  The Glidewire was then reinserted into the left ureteral orifice and up to the left renal pelvis, under fluoroscopic guidance.  A new 8 [place] by 24 cm JJ stent was then placed over the wire and into good position  within the left collecting system, confirming placement via fluoroscopy.  A right JJ of the same size and length stent was then placed in a similar fashion.  The patient's bladder was drained.  She tolerated the procedure well and was  transferred to the postanesthesia in stable condition.  Plan: I will plan to have the patient follow-up in 3-4 months to reschedule her next stent exchange.  Unfortunately, due to the high-grade obstruction within the midportion of both of her ureters, I do not think that she would be able to be managed without indwelling ureteral stents.  CC: Dr. Gavin Pound

## 2017-11-22 NOTE — Transfer of Care (Signed)
Immediate Anesthesia Transfer of Care Note  Patient: Diane Clark  Procedure(s) Performed: Procedure(s) (LRB): CYSTOSCOPY WITH RETROGRADE PYELOGRAM/URETERAL STENT PLACEMENT (Bilateral)  Patient Location: PACU  Anesthesia Type: General  Level of Consciousness: awake, sedated, patient cooperative and responds to stimulation  Airway & Oxygen Therapy: Patient Spontanous Breathing and Patient connected to [place] oxygen  Post-op Assessment: Report given to PACU RN, Post -op Vital signs reviewed and stable and Patient moving all extremities  Post vital signs: Reviewed and stable  Complications: No apparent anesthesia complications

## 2017-11-22 NOTE — Discharge Instructions (Signed)
°  Post Anesthesia Home Care Instructions  Activity: Get plenty of rest for the remainder of the day. A responsible individual must stay with you for 24 hours following the procedure.  For the next 24 hours, DO NOT: -Drive a car -Paediatric nurse -Drink alcoholic beverages -Take any medication unless instructed by your physician -Make any legal decisions or sign important papers.  Meals: Start with liquid foods such as gelatin or soup. Progress to regular foods as tolerated. Avoid greasy, spicy, heavy foods. If nausea and/or vomiting occur, drink only clear liquids until the nausea and/or vomiting subsides. Call your physician if vomiting continues.  Special Instructions/Symptoms: Your throat may feel dry or sore from the anesthesia or the breathing tube placed in your throat during surgery. If this causes discomfort, gargle with warm salt water. The discomfort should disappear within 24 hours.  If you had a scopolamine patch placed behind your ear for the management of post- operative nausea and/or vomiting:  1. The medication in the patch is effective for 72 hours, after which it should be removed.  Wrap patch in a tissue and discard in the trash. Wash hands thoroughly with soap and water. 2. You may remove the patch earlier than 72 hours if you experience unpleasant side effects which may include dry mouth, dizziness or visual disturbances. 3. Avoid touching the patch. Wash your hands with soap and water after contact with the patch.    Ibuprofen may be taken after 6:45 pm if needed

## 2017-11-25 ENCOUNTER — Encounter (HOSPITAL_BASED_OUTPATIENT_CLINIC_OR_DEPARTMENT_OTHER): Payer: Self-pay | Admitting: Urology

## 2017-12-02 DIAGNOSIS — N131 Hydronephrosis with ureteral stricture, not elsewhere classified: Secondary | ICD-10-CM | POA: Diagnosis not present

## 2017-12-02 DIAGNOSIS — K689 Other disorders of retroperitoneum: Secondary | ICD-10-CM | POA: Diagnosis not present

## 2017-12-27 ENCOUNTER — Other Ambulatory Visit: Payer: Self-pay | Admitting: Family Medicine

## 2017-12-27 DIAGNOSIS — M170 Bilateral primary osteoarthritis of knee: Secondary | ICD-10-CM

## 2018-01-02 DIAGNOSIS — N135 Crossing vessel and stricture of ureter without hydronephrosis: Secondary | ICD-10-CM | POA: Diagnosis not present

## 2018-01-21 DIAGNOSIS — N135 Crossing vessel and stricture of ureter without hydronephrosis: Secondary | ICD-10-CM | POA: Insufficient documentation

## 2018-02-05 ENCOUNTER — Other Ambulatory Visit: Payer: Self-pay | Admitting: Family Medicine

## 2018-02-05 DIAGNOSIS — Z1211 Encounter for screening for malignant neoplasm of colon: Secondary | ICD-10-CM | POA: Diagnosis not present

## 2018-02-07 DIAGNOSIS — Z79899 Other long term (current) drug therapy: Secondary | ICD-10-CM | POA: Diagnosis not present

## 2018-02-07 DIAGNOSIS — N135 Crossing vessel and stricture of ureter without hydronephrosis: Secondary | ICD-10-CM | POA: Diagnosis not present

## 2018-02-07 DIAGNOSIS — R300 Dysuria: Secondary | ICD-10-CM | POA: Diagnosis not present

## 2018-02-07 DIAGNOSIS — Z960 Presence of urogenital implants: Secondary | ICD-10-CM | POA: Diagnosis not present

## 2018-02-17 LAB — FECAL OCCULT BLOOD, IMMUNOCHEMICAL: FECAL OCCULT BLD: NEGATIVE

## 2018-03-14 DIAGNOSIS — N135 Crossing vessel and stricture of ureter without hydronephrosis: Secondary | ICD-10-CM | POA: Diagnosis not present

## 2018-03-14 DIAGNOSIS — M1990 Unspecified osteoarthritis, unspecified site: Secondary | ICD-10-CM | POA: Diagnosis not present

## 2018-03-14 DIAGNOSIS — Z01818 Encounter for other preprocedural examination: Secondary | ICD-10-CM | POA: Diagnosis not present

## 2018-03-14 DIAGNOSIS — M25569 Pain in unspecified knee: Secondary | ICD-10-CM | POA: Diagnosis not present

## 2018-03-14 DIAGNOSIS — I10 Essential (primary) hypertension: Secondary | ICD-10-CM | POA: Diagnosis not present

## 2018-03-14 DIAGNOSIS — Z79899 Other long term (current) drug therapy: Secondary | ICD-10-CM | POA: Diagnosis not present

## 2018-03-14 DIAGNOSIS — E669 Obesity, unspecified: Secondary | ICD-10-CM | POA: Diagnosis not present

## 2018-03-19 DIAGNOSIS — I10 Essential (primary) hypertension: Secondary | ICD-10-CM | POA: Diagnosis not present

## 2018-03-19 DIAGNOSIS — M1990 Unspecified osteoarthritis, unspecified site: Secondary | ICD-10-CM | POA: Diagnosis not present

## 2018-03-19 DIAGNOSIS — E669 Obesity, unspecified: Secondary | ICD-10-CM | POA: Diagnosis not present

## 2018-03-19 DIAGNOSIS — Z7952 Long term (current) use of systemic steroids: Secondary | ICD-10-CM | POA: Diagnosis not present

## 2018-03-19 DIAGNOSIS — N135 Crossing vessel and stricture of ureter without hydronephrosis: Secondary | ICD-10-CM | POA: Diagnosis not present

## 2018-03-19 DIAGNOSIS — T797XXA Traumatic subcutaneous emphysema, initial encounter: Secondary | ICD-10-CM | POA: Diagnosis not present

## 2018-03-19 DIAGNOSIS — N131 Hydronephrosis with ureteral stricture, not elsewhere classified: Secondary | ICD-10-CM | POA: Diagnosis not present

## 2018-03-20 DIAGNOSIS — N135 Crossing vessel and stricture of ureter without hydronephrosis: Secondary | ICD-10-CM | POA: Diagnosis not present

## 2018-03-24 ENCOUNTER — Other Ambulatory Visit: Payer: Self-pay

## 2018-03-24 DIAGNOSIS — M170 Bilateral primary osteoarthritis of knee: Secondary | ICD-10-CM

## 2018-03-24 MED ORDER — DULOXETINE HCL 60 MG PO CPEP: 60.0000 mg | ORAL_CAPSULE | Freq: Every day | ORAL | 1 refills | Status: DC

## 2018-04-03 DIAGNOSIS — R399 Unspecified symptoms and signs involving the genitourinary system: Secondary | ICD-10-CM | POA: Diagnosis not present

## 2018-05-01 DIAGNOSIS — N1330 Unspecified hydronephrosis: Secondary | ICD-10-CM | POA: Diagnosis not present

## 2018-05-01 DIAGNOSIS — N135 Crossing vessel and stricture of ureter without hydronephrosis: Secondary | ICD-10-CM | POA: Diagnosis not present

## 2018-05-08 DIAGNOSIS — N135 Crossing vessel and stricture of ureter without hydronephrosis: Secondary | ICD-10-CM | POA: Diagnosis not present

## 2018-08-01 DIAGNOSIS — Z79899 Other long term (current) drug therapy: Secondary | ICD-10-CM | POA: Diagnosis not present

## 2018-08-01 DIAGNOSIS — N135 Crossing vessel and stricture of ureter without hydronephrosis: Secondary | ICD-10-CM | POA: Diagnosis not present

## 2018-08-11 ENCOUNTER — Other Ambulatory Visit: Payer: Self-pay | Admitting: *Deleted

## 2018-08-11 MED ORDER — CARVEDILOL 12.5 MG PO TABS: 12.5000 mg | ORAL_TABLET | Freq: Two times a day (BID) | ORAL | 3 refills | Status: DC

## 2018-08-13 ENCOUNTER — Other Ambulatory Visit: Payer: Self-pay

## 2018-08-13 MED ORDER — CARVEDILOL 12.5 MG PO TABS: 12.5000 mg | ORAL_TABLET | Freq: Two times a day (BID) | ORAL | 3 refills | Status: DC

## 2018-10-02 ENCOUNTER — Other Ambulatory Visit: Payer: Self-pay | Admitting: *Deleted

## 2018-10-02 DIAGNOSIS — M170 Bilateral primary osteoarthritis of knee: Secondary | ICD-10-CM

## 2018-10-02 MED ORDER — DULOXETINE HCL 60 MG PO CPEP: 60.0000 mg | ORAL_CAPSULE | Freq: Every day | ORAL | 3 refills | Status: DC

## 2018-10-29 DIAGNOSIS — Z01419 Encounter for gynecological examination (general) (routine) without abnormal findings: Secondary | ICD-10-CM | POA: Diagnosis not present

## 2018-10-29 DIAGNOSIS — Z1231 Encounter for screening mammogram for malignant neoplasm of breast: Secondary | ICD-10-CM | POA: Diagnosis not present

## 2018-10-29 DIAGNOSIS — M25569 Pain in unspecified knee: Secondary | ICD-10-CM | POA: Insufficient documentation

## 2018-10-29 DIAGNOSIS — Z6830 Body mass index (BMI) 30.0-30.9, adult: Secondary | ICD-10-CM | POA: Diagnosis not present

## 2018-10-29 DIAGNOSIS — I10 Essential (primary) hypertension: Secondary | ICD-10-CM | POA: Insufficient documentation

## 2018-10-31 DIAGNOSIS — Z79899 Other long term (current) drug therapy: Secondary | ICD-10-CM | POA: Diagnosis not present

## 2019-01-25 IMAGING — MG DIGITAL SCREENING BILATERAL MAMMOGRAM WITH TOMO AND CAD
8 series · 8 of 24 positions shown · non-contrast
Comparison: Previous exam(s).

CLINICAL DATA: Screening.

EXAM:
DIGITAL SCREENING BILATERAL MAMMOGRAM WITH TOMO AND CAD

[R MLO synth-2D]
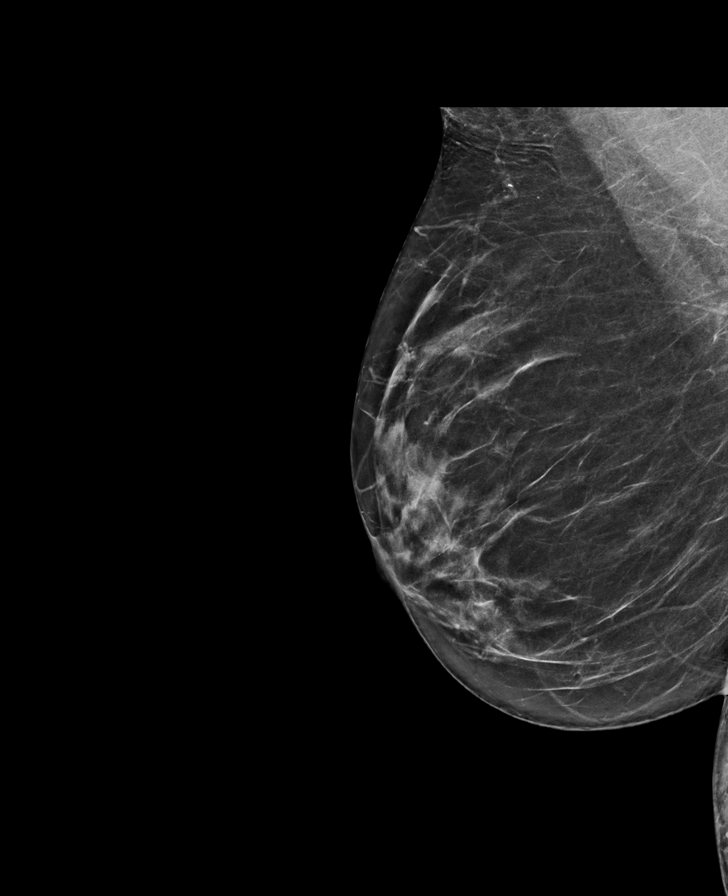

[R CC synth-2D]
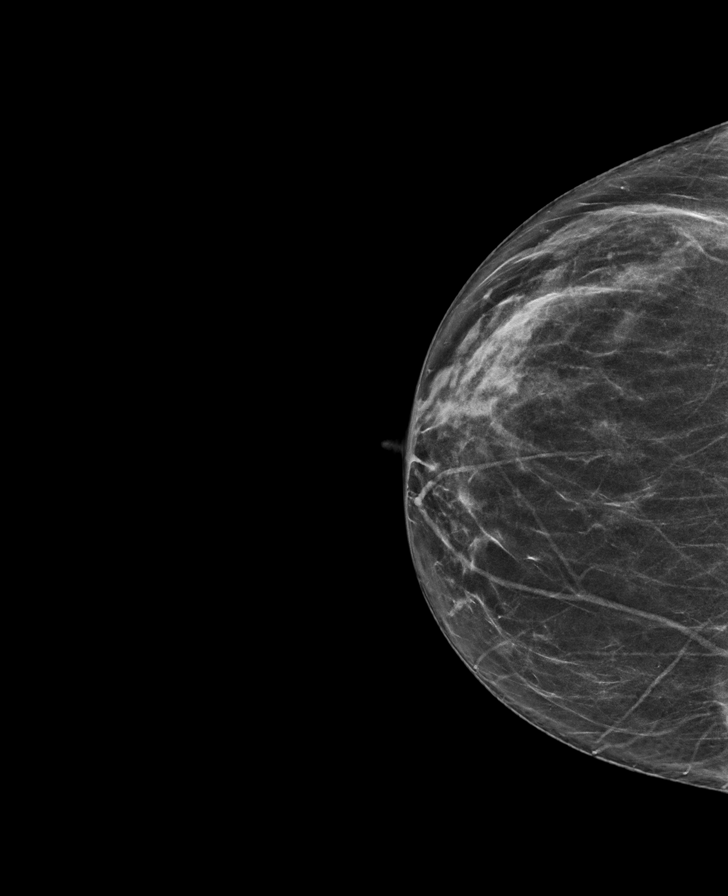

[L MLO synth-2D]
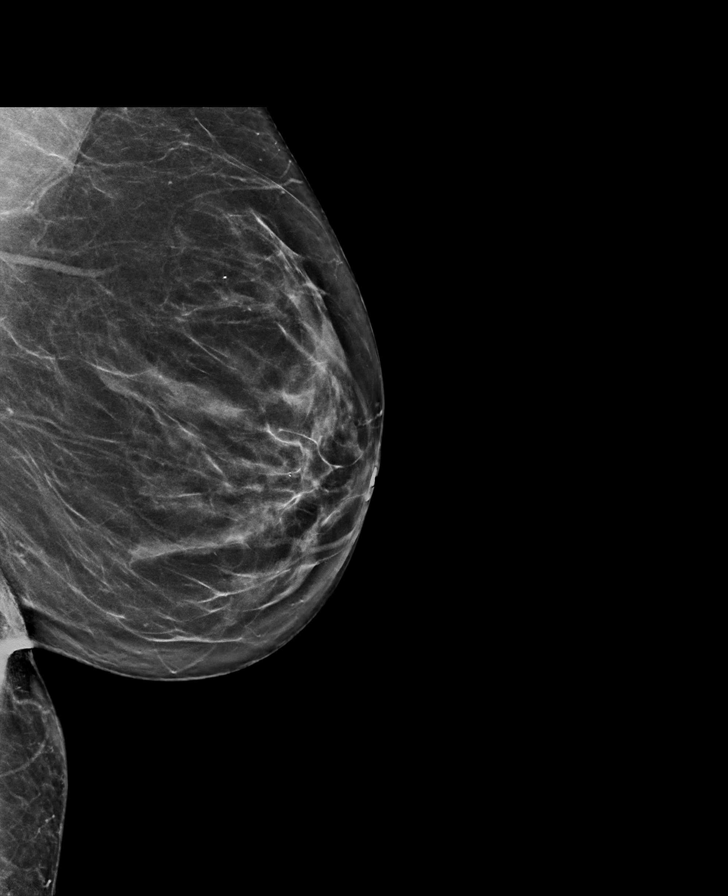

[L CC synth-2D]
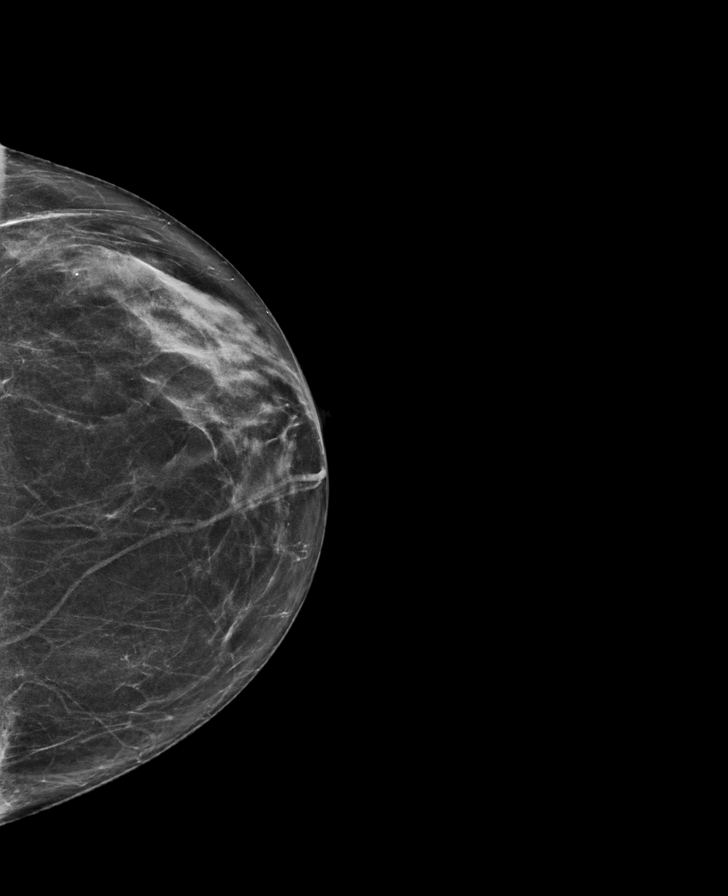

[R CC tomo · tomo slice 33/65.0]
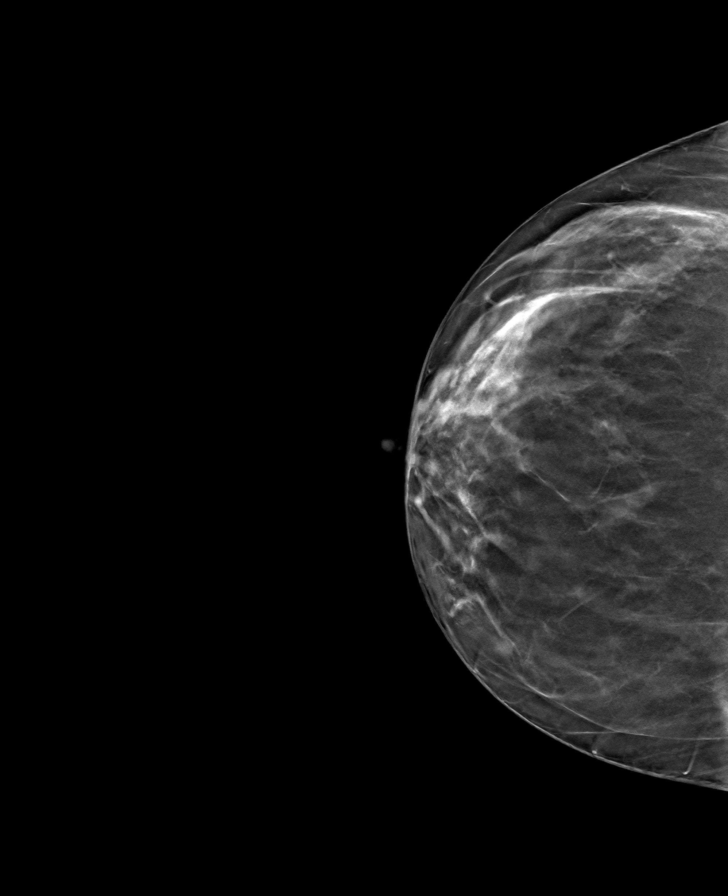

[L CC tomo · tomo slice 35/68.0]
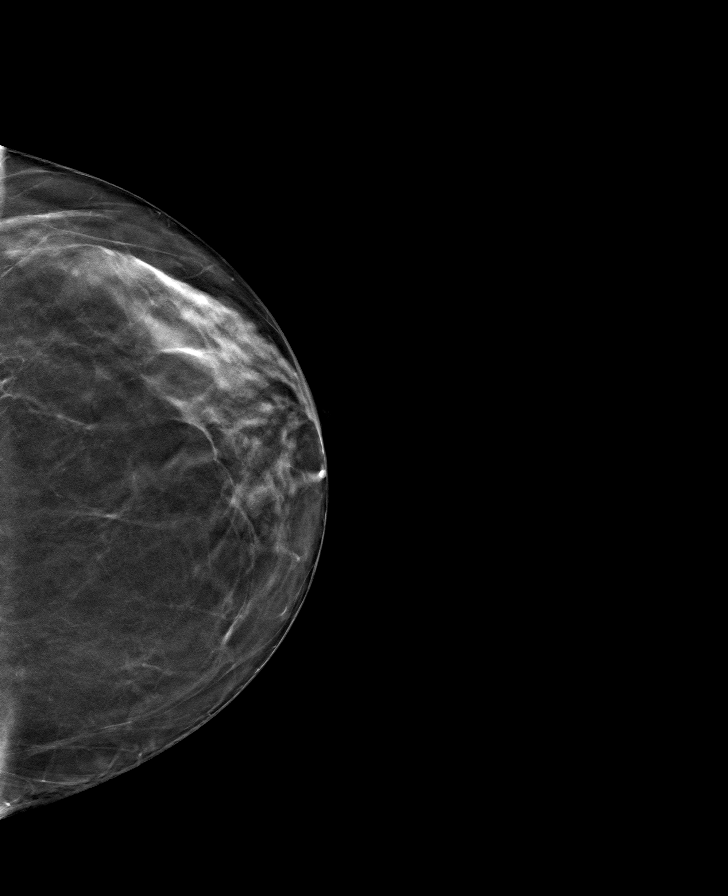

[R MLO tomo · tomo slice 36/71.0]
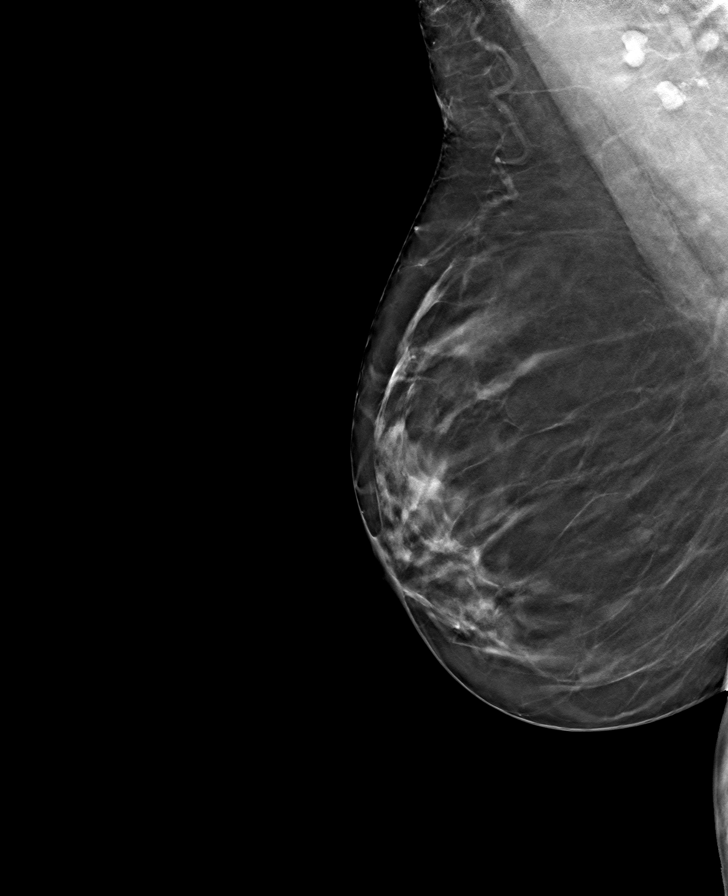

[L MLO tomo · tomo slice 37/73.0]
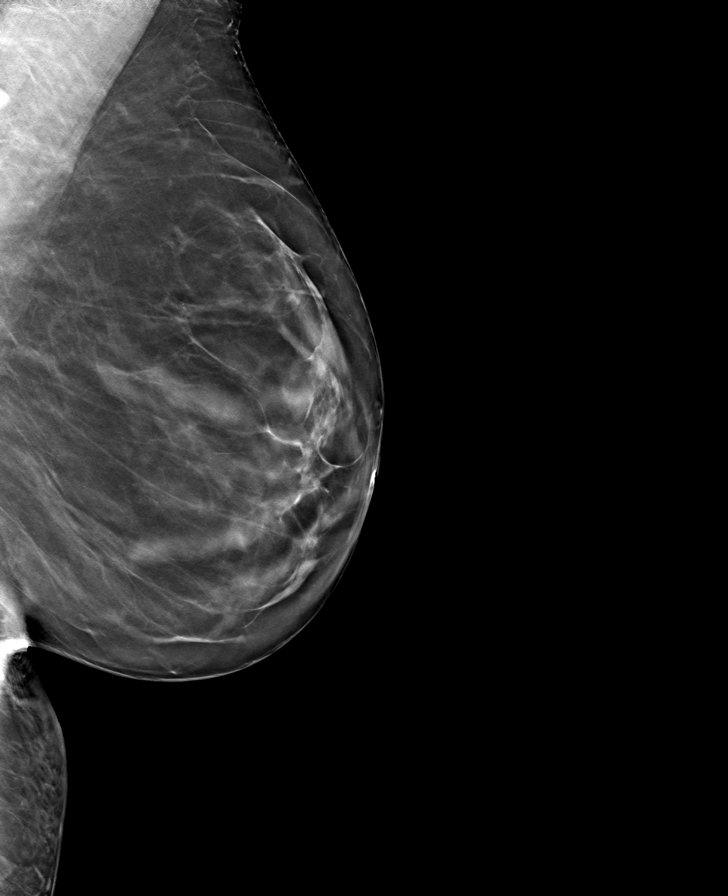

[8 of 24 positions shown; findings below may reference images not displayed]

ACR Breast Density Category c: The breast tissue is heterogeneously
dense, which may obscure small masses.
FINDINGS: There are no findings suspicious for malignancy. Images were
processed with CAD.
IMPRESSION: No mammographic evidence of malignancy. A result letter of this
screening mammogram will be mailed directly to the patient.

RECOMMENDATION:
Screening mammogram in one year. (Code:[id number])

BI-RADS CATEGORY  1: Negative.

## 2019-02-05 DIAGNOSIS — Z79899 Other long term (current) drug therapy: Secondary | ICD-10-CM | POA: Diagnosis not present

## 2019-05-04 ENCOUNTER — Telehealth: Payer: Self-pay | Admitting: Family Medicine

## 2019-05-04 NOTE — Telephone Encounter (Signed)
Patient called to schedule her physical appointment with Dr. Shan Levans. We scheduled her for her next available appointment on 06/03/19.   Patient will be out of lisinopril and would like to know if she can have enough to last her until this appointment. She uses [facility] on [address]

## 2019-05-05 ENCOUNTER — Other Ambulatory Visit: Payer: Self-pay | Admitting: Family Medicine

## 2019-05-05 DIAGNOSIS — I10 Essential (primary) hypertension: Secondary | ICD-10-CM

## 2019-05-05 MED ORDER — LISINOPRIL 40 MG PO TABS: 40.0000 mg | ORAL_TABLET | Freq: Every day | ORAL | 0 refills | Status: DC

## 2019-05-05 NOTE — Telephone Encounter (Signed)
I have sent in her lisinopril.  Thank you!

## 2019-06-03 ENCOUNTER — Encounter: Payer: Self-pay | Admitting: Family Medicine

## 2019-06-03 ENCOUNTER — Other Ambulatory Visit: Payer: Self-pay

## 2019-06-03 ENCOUNTER — Ambulatory Visit (INDEPENDENT_AMBULATORY_CARE_PROVIDER_SITE_OTHER): Payer: 59 | Admitting: Family Medicine

## 2019-06-03 VITALS — BP 126/74 | HR 69 | Ht 68.0 in | Wt 211.6 lb

## 2019-06-03 DIAGNOSIS — E785 Hyperlipidemia, unspecified: Secondary | ICD-10-CM | POA: Diagnosis not present

## 2019-06-03 DIAGNOSIS — Z1211 Encounter for screening for malignant neoplasm of colon: Secondary | ICD-10-CM | POA: Diagnosis not present

## 2019-06-03 DIAGNOSIS — M170 Bilateral primary osteoarthritis of knee: Secondary | ICD-10-CM | POA: Diagnosis not present

## 2019-06-03 DIAGNOSIS — I10 Essential (primary) hypertension: Secondary | ICD-10-CM

## 2019-06-03 MED ORDER — DULOXETINE HCL 60 MG PO CPEP: 60.0000 mg | ORAL_CAPSULE | Freq: Every day | ORAL | 3 refills | Status: DC

## 2019-06-03 MED ORDER — LISINOPRIL 40 MG PO TABS: 40.0000 mg | ORAL_TABLET | Freq: Every day | ORAL | 3 refills | Status: DC

## 2019-06-03 MED ORDER — CARVEDILOL 12.5 MG PO TABS: 12.5000 mg | ORAL_TABLET | Freq: Two times a day (BID) | ORAL | 3 refills | Status: DC

## 2019-06-03 NOTE — Patient Instructions (Addendum)
It was nice seeing you today Ms. Webre!  We are checking your electrolytes, kidney function, and cholesterol today.  We will be in touch regarding these results.  I am putting the order in for your colonoscopy.  Please call below our office to get this scheduled.  Please make sure to get your mammogram this year.  I have refilled your medicines for 1 year.  If you have any questions or concerns, please feel free to call the clinic.   Be well,  Dr. Shan Levans

## 2019-06-03 NOTE — Progress Notes (Signed)
    SUBJECTIVE:   CHIEF COMPLAINT / HPI:   Follow-up hypertension and other health maintenance Patient reports that she is doing well overall and is taking all of her prescribed medications.  She has no specific complaints today, but she would like labs and knows that she needs a colonoscopy.  She also knows that she needs her yearly mammogram.  She has no other concerns today.   PERTINENT  PMH / PSH: Retroperitoneal fibromatosis, bilateral ureteral obstruction  OBJECTIVE:   BP 126/74   Pulse 69   Ht 5\' 8"  (1.727 m)   Wt 211 lb 9.6 oz (96 kg)   LMP 03/08/2010   SpO2 99%   BMI 32.17 kg/m   General: well appearing, appears stated age Cardiac: RRR, no MRG Respiratory: CTAB, no rhonchi, rales, or wheezing, normal work of breathing Skin: no rashes or other lesions, warm and well perfused Psych: appropriate mood and affect   ASSESSMENT/PLAN:   HYPERTENSION, BENIGN SYSTEMIC Well-controlled.  Will obtain BMP today.  Will refill blood pressure medications.  OBESITY, NOS Will obtain a lipid panel today.  Screening for colon cancer Will place order for colonoscopy.  Patient was given number for [facility] since their office is close to her.     Kathrene Alu, MD [place]

## 2019-06-04 ENCOUNTER — Encounter: Payer: Self-pay | Admitting: Internal Medicine

## 2019-06-04 DIAGNOSIS — Z1211 Encounter for screening for malignant neoplasm of colon: Secondary | ICD-10-CM | POA: Insufficient documentation

## 2019-06-04 LAB — BASIC METABOLIC PANEL
BUN/Creatinine Ratio: 20 (ref 12–28)
BUN: 12 mg/dL (ref 8–27)
CO2: 28 mmol/L (ref 20–29)
Calcium: 9.9 mg/dL (ref 8.7–10.3)
Chloride: 98 mmol/L (ref 96–106)
Creatinine, Ser: 0.59 mg/dL (ref 0.57–1.00)
GFR calc Af Amer: 114 mL/min/{1.73_m2} (ref >59)
GFR calc non Af Amer: 99 mL/min/{1.73_m2} (ref >59)
Glucose: 66 mg/dL (ref 65–99)
Potassium: 4.5 mmol/L (ref 3.5–5.2)
Sodium: 139 mmol/L (ref 134–144)

## 2019-06-04 LAB — LIPID PANEL
Chol/HDL Ratio: 2.5 ratio (ref 0.0–4.4)
Cholesterol, Total: 209 mg/dL — ABNORMAL HIGH (ref 100–199)
HDL: 83 mg/dL (ref >39)
LDL Chol Calc (NIH): 111 mg/dL — ABNORMAL HIGH (ref 0–99)
Triglycerides: 84 mg/dL (ref 0–149)
VLDL Cholesterol Cal: 15 mg/dL (ref 5–40)

## 2019-06-04 NOTE — Assessment & Plan Note (Signed)
Well-controlled.  Will obtain BMP today.  Will refill blood pressure medications.

## 2019-06-04 NOTE — Assessment & Plan Note (Signed)
Will obtain a lipid panel today.

## 2019-06-04 NOTE — Assessment & Plan Note (Signed)
Will place order for colonoscopy.  Patient was given number for [facility] since their office is close to her.

## 2019-06-05 NOTE — Progress Notes (Signed)
Called with results.  Discussed that a statin would only lower her ASCVD risk by about 1%, so no need to start that medication.  Will recheck in one year.  Patient educated about Mediterranean diet.

## 2019-07-08 ENCOUNTER — Other Ambulatory Visit: Payer: Self-pay

## 2019-07-08 ENCOUNTER — Ambulatory Visit (AMBULATORY_SURGERY_CENTER): Payer: Self-pay | Admitting: *Deleted

## 2019-07-08 VITALS — Ht 68.0 in | Wt 215.0 lb

## 2019-07-08 DIAGNOSIS — Z1211 Encounter for screening for malignant neoplasm of colon: Secondary | ICD-10-CM

## 2019-07-08 MED ORDER — SUPREP BOWEL PREP KIT 17.5-3.13-1.6 GM/177ML PO SOLN: ORAL | 0 refills | Status: DC

## 2019-07-08 NOTE — Progress Notes (Signed)
2nd dose of covid vaccine 04-11-19  Pt is aware that care partner will wait in the car during procedure; if they feel like they will be too hot or cold to wait in the car; they may wait in the 4 th floor lobby. Patient is aware to bring only one care partner. We want them to wear a mask (we do not have any that we can provide them), practice social distancing, and we will check their temperatures when they get here.  I did remind the patient that their care partner needs to stay in the parking lot the entire time and have a cell phone available, we will call them when the pt is ready for discharge. Patient will wear mask into building.  No seizure activity since age 59 per pt   No trouble with anesthesia, difficulty with intubation or hx/fam hx of malignant hyperthermia per pt   No egg or soy allergy  No home oxygen use   No medications for weight loss taken  emmi information given  Pt denies constipation issues

## 2019-07-22 ENCOUNTER — Ambulatory Visit (AMBULATORY_SURGERY_CENTER): Payer: 59 | Admitting: Internal Medicine

## 2019-07-22 ENCOUNTER — Encounter: Payer: Self-pay | Admitting: Internal Medicine

## 2019-07-22 ENCOUNTER — Other Ambulatory Visit: Payer: Self-pay

## 2019-07-22 VITALS — BP 122/69 | HR 67 | Temp 96.8°F | Resp 17 | Ht 68.0 in | Wt 215.0 lb

## 2019-07-22 DIAGNOSIS — Z1211 Encounter for screening for malignant neoplasm of colon: Secondary | ICD-10-CM

## 2019-07-22 MED ORDER — SODIUM CHLORIDE 0.9 % IV SOLN: 500.0000 mL | Freq: Once | INTRAVENOUS | Status: DC

## 2019-07-22 NOTE — Progress Notes (Signed)
Pt's states no medical or surgical changes since previsit or office visit.  CW - vitals 

## 2019-07-22 NOTE — Patient Instructions (Addendum)
I was not able to complete the colonoscopy - areas seen (about 2/3-3/43) were ok.  Your colon is loose and floppy - can see that it is loosely attached based upon the CT scan from 2018.  It is the way you are built and I do not think it is possible to complete a colonoscopy in you.  My recommendation is that you have annual stool checks for blood or do a Cologuard every 3 years through your primary doctor.  If those tests are abnormal we could then determine how best to evaluate them.  I appreciate the opportunity to care for you. Gatha Mayer, MD, FACG    YOU HAD AN ENDOSCOPIC PROCEDURE TODAY AT [facility]:   Refer to the procedure report that was given to you for any specific questions about what was found during the examination.  If the procedure report does not answer your questions, please call your gastroenterologist to clarify.  If you requested that your care partner not be given the details of your procedure findings, then the procedure report has been included in a sealed envelope for you to review at your convenience later.  YOU SHOULD EXPECT: Some feelings of bloating in the abdomen. Passage of more gas than usual.  Walking can help get rid of the air that was put into your GI tract during the procedure and reduce the bloating. If you had a lower endoscopy (such as a colonoscopy or flexible sigmoidoscopy) you may notice spotting of blood in your stool or on the toilet paper. If you underwent a bowel prep for your procedure, you may not have a normal bowel movement for a few days.  Please Note:  You might notice some irritation and congestion in your nose or some drainage.  This is from the oxygen used during your procedure.  There is no need for concern and it should clear up in a day or so.  SYMPTOMS TO REPORT IMMEDIATELY:   Following lower endoscopy (colonoscopy or flexible sigmoidoscopy):  Excessive amounts of blood in the stool  Significant tenderness or  worsening of abdominal pains  Swelling of the abdomen that is new, acute  Fever of 100F or higher   For urgent or emergent issues, a gastroenterologist can be reached at any hour by calling [phone]. Do not use MyChart messaging for urgent concerns.    DIET:  We do recommend a small meal at first, but then you may proceed to your regular diet.  Drink plenty of fluids but you should avoid alcoholic beverages for 24 hours.  ACTIVITY:  You should plan to take it easy for the rest of today and you should NOT DRIVE or use heavy machinery until tomorrow (because of the sedation medicines used during the test).    FOLLOW UP: Our staff will call the number listed on your records 48-72 hours following your procedure to check on you and address any questions or concerns that you may have regarding the information given to you following your procedure. If we do not reach you, we will leave a message.  We will attempt to reach you two times.  During this call, we will ask if you have developed any symptoms of COVID 19. If you develop any symptoms (ie: fever, flu-like symptoms, shortness of breath, cough etc.) before then, please call [phone].  If you test positive for Covid 19 in the 2 weeks post procedure, please call and report this information to [place].    If  any biopsies were taken you will be contacted by phone or by letter within the next 1-3 weeks.  Please call us at [phone] if you have not heard about the biopsies in 3 weeks.    SIGNATURES/CONFIDENTIALITY: You and/or your care partner have signed paperwork which will be entered into your electronic medical record.  These signatures attest to the fact that that the information above on your After Visit Summary has been reviewed and is understood.  Full responsibility of the confidentiality of this discharge information lies with you and/or your care-partner.   Resume medications.

## 2019-07-22 NOTE — Progress Notes (Signed)
Report given to PACU, vss 

## 2019-07-22 NOTE — Op Note (Signed)
[place] Patient Name: Diane Clark Procedure Date: 07/22/2019 12:16 PM MRN: [id number] Endoscopist: Gatha Mayer , MD Age: 59 Referring MD:  Date of Birth: 14-Aug-1960 Gender: Female Account #: [id number] Procedure:                Colonoscopy Indications:              Screening for colorectal malignant neoplasm Medicines:                Propofol per Anesthesia, Monitored Anesthesia Care Procedure:                Pre-Anesthesia Assessment:                           - Prior to the procedure, a History and Physical                            was performed, and patient medications and                            allergies were reviewed. The patient's tolerance of                            previous anesthesia was also reviewed. The risks                            and benefits of the procedure and the sedation                            options and risks were discussed with the patient.                            All questions were answered, and informed consent                            was obtained. Prior Anticoagulants: The patient has                            taken no previous anticoagulant or antiplatelet                            agents. ASA Grade Assessment: II - A patient with                            mild systemic disease. After reviewing the risks                            and benefits, the patient was deemed in                            satisfactory condition to undergo the procedure.                           After obtaining informed consent, the colonoscope  was passed under direct vision. Throughout the                            procedure, the patient's blood pressure, pulse, and                            oxygen saturations were monitored continuously. The                            Colonoscope was introduced through the anus and                            advanced to the the transverse colon. The                             colonoscopy was performed with difficulty due to a                            redundant colon and significant looping. The                            patient tolerated the procedure well. The quality                            of the bowel preparation was excellent. The bowel                            preparation used was SUPREP via split dose                            instruction. The rectum was photographed. Scope In: 12:20:42 PM Scope Out: 12:46:30 PM Total Procedure Duration: 0 hours 25 minutes 48 seconds  Findings:                 The perianal and digital rectal examinations were                            normal.                           The colon (entire examined portion) appeared normal.                           The exam was otherwise without abnormality on                            direct and retroflexion views. Complications:            No immediate complications. Estimated Blood Loss:     Estimated blood loss: none. Impression:               - The entire examined colon is normal. EXAM TO                            TRANSVERSE COLON DUE TO REDUNDANT COLON - POSITION  CHANGES (MULTIPLE) AND ABDOMINAL BINDER USED.                            PEDIATRIC SCOPE CHANGED TO ADULT. SHE HAS LAX                            ILEOCOLIC MESENTERY AS PER 2018 CT - SEE BELOW                           - The examination was otherwise normal on direct                            and retroflexion views.                           - No specimens collected. Recommendation:           - Patient has a contact number available for                            emergencies. The signs and symptoms of potential                            delayed complications were discussed with the                            patient. Return to normal activities tomorrow.                            Written discharge instructions were provided to the                            patient.                            - Resume previous diet.                           - Continue present medications.                           - She has a lax ileocolic mesentery as seen on 2018                            with a high cecum - I doubt complete colonoscopy is                            possible.                           I reecommend annual iFOBT vs Cologuard every 3                            years through PCP and if abnormal then  reassess/reconsider an attempt at colobnoscopy vs                            other imaging. Gatha Mayer, MD 07/22/2019 1:00:01 PM This report has been signed electronically.

## 2019-07-24 ENCOUNTER — Telehealth: Payer: Self-pay

## 2019-07-24 NOTE — Telephone Encounter (Signed)
  Follow up Call-  Call back number 07/22/2019  Post procedure Call Back phone  # [phone]  Permission to leave phone message Yes  Some recent data might be hidden     Patient questions:  Do you have a fever, pain , or abdominal swelling? No. Pain Score  0 *  Have you tolerated food without any problems? Yes.    Have you been able to return to your normal activities? Yes.    Do you have any questions about your discharge instructions: Diet   No. Medications  No. Follow up visit  No.  Do you have questions or concerns about your Care? No.  Actions: * If pain score is 4 or above: 1. No action needed, pain <4.Have you developed a fever since your procedure? no  2.   Have you had an respiratory symptoms (SOB or cough) since your procedure? no  3.   Have you tested positive for COVID 19 since your procedure no  4.   Have you had any family members/close contacts diagnosed with the COVID 19 since your procedure?  no   If yes to any of these questions please route to Joylene John, RN and Erenest Rasher, RN

## 2019-09-24 ENCOUNTER — Other Ambulatory Visit: Payer: Self-pay | Admitting: Rheumatology

## 2019-09-24 DIAGNOSIS — I776 Arteritis, unspecified: Secondary | ICD-10-CM

## 2019-10-02 ENCOUNTER — Other Ambulatory Visit: Payer: Self-pay

## 2019-10-02 ENCOUNTER — Ambulatory Visit
Admission: RE | Admit: 2019-10-02 | Discharge: 2019-10-02 | Disposition: A | Discharge status: Home / Self Care | Payer: 59 | Source: Ambulatory Visit | Attending: Rheumatology | Admitting: Rheumatology

## 2019-10-02 DIAGNOSIS — I776 Arteritis, unspecified: Secondary | ICD-10-CM

## 2019-10-02 MED ORDER — GADOBENATE DIMEGLUMINE 529 MG/ML IV SOLN
20.0000 mL | Freq: Once | INTRAVENOUS | Status: AC | PRN
  Administered 2019-10-02: 20 mL via INTRAVENOUS

## 2019-10-14 ENCOUNTER — Other Ambulatory Visit: Payer: 59

## 2019-10-18 NOTE — Progress Notes (Signed)
    SUBJECTIVE:   CHIEF COMPLAINT / HPI:   Patient presents for her annual physical exam. No complaints or concerns today.  Health Maintenance:  -Due for flu shot. -Patient had colonoscopy on 07/22/2019 but it was incomplete due to anatomical factors, therefore Cologuard was recommended. -Due for PAP and mammogram, which are scheduled for December 2021 with her OBGYN (Dr. Gaetano Net at [facility]).   PERTINENT  PMH / PSH: HTN, Retroperitoneal fibromatosis, bilateral ureteral obstruction  OBJECTIVE:   BP 102/60   Pulse 79   Ht 5\' 8"  (1.727 m)   Wt 213 lb (96.6 kg)   LMP 03/08/2010   SpO2 98%   BMI 32.39 kg/m   Gen: alert, well-appearing, NAD Neck: no cervical or supraclavicular lymphadenopathy Thyroid: normal thyroid Cardiac: RRR, normal S1/S2, no murmurs, rubs or gallops Lungs: Normal work of breathing, lungs CTA without wheezes or rales Abd: soft, nontender, nondistended Ext: no peripheral edema   ASSESSMENT/PLAN:   Annual physical exam, routine health maintenance -Given flu shot today -Ordered Cologuard -Patient to receive mammogram and PAP in 2 months at [facility] appointment (will follow up to ensure these are completed).    Discussed case with Dr. Gwendlyn Deutscher.   Alcus Dad, MD [place]

## 2019-10-19 ENCOUNTER — Encounter: Payer: Self-pay | Admitting: Family Medicine

## 2019-10-19 ENCOUNTER — Other Ambulatory Visit: Payer: Self-pay

## 2019-10-19 ENCOUNTER — Ambulatory Visit (INDEPENDENT_AMBULATORY_CARE_PROVIDER_SITE_OTHER): Payer: 59 | Admitting: Family Medicine

## 2019-10-19 VITALS — BP 102/60 | HR 79 | Ht 68.0 in | Wt 213.0 lb

## 2019-10-19 DIAGNOSIS — Z0000 Encounter for general adult medical examination without abnormal findings: Secondary | ICD-10-CM | POA: Diagnosis not present

## 2019-10-19 NOTE — Assessment & Plan Note (Deleted)
-  Given flu shot today -Will order Cologuard -Patient to receive mammogram and PAP in 2 months at [facility] appointment (will follow up to ensure these are completed).

## 2019-10-19 NOTE — Patient Instructions (Signed)
It was great to meet you!  As discussed, we will mail you a Cologuard kit for colon cancer screening, since they were unable to fully complete your colonoscopy.  Keep up the great work and we'll see you next year.  Take care and seek immediate care sooner if you develop any concerns.  Dr. [facility]

## 2019-10-19 NOTE — Assessment & Plan Note (Signed)
-  Given flu shot today -Will order Cologuard -Patient to receive mammogram and PAP in 2 months at [facility] appointment (will follow up to ensure these are completed).

## 2019-12-23 DIAGNOSIS — Z9889 Other specified postprocedural states: Secondary | ICD-10-CM | POA: Insufficient documentation

## 2019-12-23 LAB — HM PAP SMEAR: HM Pap smear: NEGATIVE

## 2020-09-07 ENCOUNTER — Other Ambulatory Visit: Payer: Self-pay

## 2020-09-07 ENCOUNTER — Encounter (HOSPITAL_COMMUNITY): Payer: Self-pay

## 2020-09-07 ENCOUNTER — Ambulatory Visit (HOSPITAL_COMMUNITY)
Admission: EM | Admit: 2020-09-07 | Discharge: 2020-09-07 | Disposition: A | Discharge status: Home / Self Care | Payer: 59 | Attending: Emergency Medicine | Admitting: Emergency Medicine

## 2020-09-07 DIAGNOSIS — Z79899 Other long term (current) drug therapy: Secondary | ICD-10-CM | POA: Diagnosis not present

## 2020-09-07 DIAGNOSIS — I10 Essential (primary) hypertension: Secondary | ICD-10-CM | POA: Diagnosis not present

## 2020-09-07 DIAGNOSIS — Z760 Encounter for issue of repeat prescription: Secondary | ICD-10-CM

## 2020-09-07 DIAGNOSIS — M170 Bilateral primary osteoarthritis of knee: Secondary | ICD-10-CM | POA: Diagnosis not present

## 2020-09-07 MED ORDER — DULOXETINE HCL 60 MG PO CPEP: 60.0000 mg | ORAL_CAPSULE | Freq: Every day | ORAL | 0 refills | Status: DC

## 2020-09-07 MED ORDER — CARVEDILOL 12.5 MG PO TABS: 12.5000 mg | ORAL_TABLET | Freq: Two times a day (BID) | ORAL | 0 refills | Status: DC

## 2020-09-07 MED ORDER — LISINOPRIL 40 MG PO TABS: 40.0000 mg | ORAL_TABLET | Freq: Every day | ORAL | 0 refills | Status: DC

## 2020-09-07 NOTE — ED Provider Notes (Signed)
[place]    CSN: [id number] Arrival date & time: 09/07/20  [id number]      History   Chief Complaint Chief Complaint  Patient presents with   Medication Refill    HPI Diane Clark is a 60 y.o. female.   Patient here for medication refill.  Reports taking carvedilol, lisinopril, and duloxetine.  Reports used to follow-up with family medicine but states that they have moved and she is no longer able to see them.  Denies any new concerns.  Denies any trauma, injury, or other precipitating event.  Denies any specific alleviating or aggravating factors.  Denies any fevers, chest pain, shortness of breath, N/V/D, numbness, tingling, weakness, abdominal pain, or headaches.    The history is provided by the patient.  Medication Refill Reason for request:  Clinic/provider not available  Past Medical History:  Diagnosis Date   Cellulitis 03/2017   Right ear   Hydronephrosis    Bilateral   Hypertension    OA (osteoarthritis) rheumotologist-  dr a. Trudie Reed   knees, shoulders   Obesity    Retroperitoneal fibrosis    on Methotrexate for   Seizures ([place])    with fever. Last one age 74   Ureteral obstruction    bilaterally--- secondary to hx retroperitoneal fibroids-- treatment indwelling ureteral stents   Uterine fibroid     Patient Active Problem List   Diagnosis Date Noted   Annual physical exam 10/19/2019   Hypertensive disorder 10/29/2018   Knee pain 10/29/2018   Retroperitoneal fibrosis 01/21/2018   Retroperitoneal fibromatosis 01/25/2017   Chronic fatigue 01/25/2017   Bilateral ureteral obstruction 11/09/2016   Osteoarthritis of knees, bilateral 07/29/2015   OBESITY, NOS 03/14/2006   HYPERTENSION, BENIGN SYSTEMIC 03/14/2006    Past Surgical History:  Procedure Laterality Date   CERVICAL CONE BIOPSY  1990   CYSTOSCOPY W/ URETERAL STENT PLACEMENT Bilateral 01/02/2017   Procedure: CYSTOSCOPY WITH STENT REPLACEMENT;  Surgeon: Ceasar Mons, MD;   Location: [facility];  Service: Urology;  Laterality: Bilateral;  ONLY NEEDS 30 MIN   CYSTOSCOPY W/ URETERAL STENT PLACEMENT Bilateral 08/09/2017   Procedure: CYSTOSCOPY WITH Aura Dials;  Surgeon: Ceasar Mons, MD;  Location: [facility];  Service: Urology;  Laterality: Bilateral;  ONLY NEEDS 30 MIN   CYSTOSCOPY W/ URETERAL STENT PLACEMENT Bilateral 11/22/2017   Procedure: CYSTOSCOPY WITH RETROGRADE PYELOGRAM/URETERAL STENT PLACEMENT;  Surgeon: Ceasar Mons, MD;  Location: [facility];  Service: Urology;  Laterality: Bilateral;   CYSTOSCOPY WITH RETROGRADE PYELOGRAM, URETEROSCOPY AND STENT PLACEMENT Bilateral 11/09/2016   Procedure: CYSTOSCOPY WITH RETROGRADE PYELOGRAM, BILATERAL URETEROSCOPY,   AND BILATERAL STENT PLACEMENT;  Surgeon: Ceasar Mons, MD;  Location: [facility];  Service: Urology;  Laterality: Bilateral;   CYSTOSCOPY WITH STENT PLACEMENT Bilateral 04/17/2017   Procedure: CYSTOSCOPY/ RETORGRADE/ STENT EXCHANGE;  Surgeon: Ceasar Mons, MD;  Location: [facility];  Service: Urology;  Laterality: Bilateral;  ONLY NEEDS 30 MIN   ROBOTIC XI URETEROLYSIS     March 2020    OB History   No obstetric history on file.      Home Medications    Prior to Admission medications   Medication Sig Start Date End Date Taking? Authorizing Provider  acetaminophen (TYLENOL) 500 MG tablet Take 500 mg by mouth every 6 (six) hours as needed for moderate pain.    [provider]  carvedilol (COREG) 12.5 MG tablet Take 1 tablet (12.5 mg total) by mouth 2 (  two) times daily with a meal. 09/07/20   Pearson Forster, NP  cholecalciferol (VITAMIN D3) 25 MCG (1000 UNIT) tablet Take 1,000 Units by mouth daily.    [provider]  DULoxetine (CYMBALTA) 60 MG capsule Take 1 capsule (60 mg total) by mouth daily. 09/07/20   Pearson Forster, NP  folic acid (FOLVITE) [id number] MCG tablet  Take 400 mcg by mouth daily.    [provider]  lisinopril (ZESTRIL) 40 MG tablet Take 1 tablet (40 mg total) by mouth daily. 09/07/20   Pearson Forster, NP  methotrexate 2.5 MG tablet Take 2.5 mg by mouth once a week. Takes 6 of the 2.5 mg pills on wednesday    [provider]  Multiple Vitamin (MULTIVITAMIN) tablet Take 1 tablet by mouth daily.    [provider]    Family History Family History  Problem Relation Age of Onset   Kidney failure Mother    Diabetes Mother    Heart disease Mother    Kidney failure Sister        kidney transplant   Hypertension Sister    Pancreatic cancer Brother    Colon cancer Neg Hx    Esophageal cancer Neg Hx    Rectal cancer Neg Hx    Stomach cancer Neg Hx     Social History Social History   Tobacco Use   Smoking status: Former    Packs/day: 1.00    Years: 10.00    Pack years: 10.00    Types: Cigarettes    Quit date: 12/14/1986    Years since quitting: 33.7   Smokeless tobacco: Never  Vaping Use   Vaping Use: Never used  Substance Use Topics   Alcohol use: Yes    Comment: occ wine   Drug use: No     Allergies   Lidocaine   Review of Systems Review of Systems  All other systems reviewed and are negative.   Physical Exam Triage Vital Signs ED Triage Vitals  Enc Vitals Group     BP 09/07/20 0933 112/75     Pulse Rate 09/07/20 0933 81     Resp 09/07/20 0933 18     Temp 09/07/20 0933 98.6 F (37 C)     Temp Source 09/07/20 0933 Oral     SpO2 09/07/20 0933 98 %     Weight --      Height --      Head Circumference --      Peak Flow --      Pain Score 09/07/20 0937 0     Pain Loc --      Pain Edu? --      Excl. in [place]? --    No data found.  Updated Vital Signs BP 112/75 (BP Location: Left Arm)   Pulse 81   Temp 98.6 F (37 C) (Oral)   Resp 18   LMP 03/08/2010   SpO2 98%   Visual Acuity Right Eye Distance:   Left Eye Distance:   Bilateral Distance:    Right Eye Near:   Left Eye  Near:    Bilateral Near:     Physical Exam Vitals and nursing note reviewed.  Constitutional:      General: She is not in acute distress.    Appearance: Normal appearance. She is not ill-appearing, toxic-appearing or diaphoretic.  HENT:     Head: Normocephalic and atraumatic.  Eyes:     Conjunctiva/sclera: Conjunctivae normal.  Cardiovascular:  Rate and Rhythm: Normal rate and regular rhythm.     Pulses: Normal pulses.     Heart sounds: Normal heart sounds.  Pulmonary:     Effort: Pulmonary effort is normal.     Breath sounds: Normal breath sounds.  Abdominal:     General: Abdomen is flat.  Musculoskeletal:        General: Normal range of motion.     Cervical back: Normal range of motion.  Skin:    General: Skin is warm and dry.  Neurological:     General: No focal deficit present.     Mental Status: She is alert and oriented to person, place, and time.  Psychiatric:        Mood and Affect: Mood normal.     UC Treatments / Results  Labs (all labs ordered are listed, but only abnormal results are displayed) Labs Reviewed - No data to display  EKG   Radiology No results found.  Procedures Procedures (including critical care time)  Medications Ordered in UC Medications - No data to display  Initial Impression / Assessment and Plan / UC Course  I have reviewed the triage vital signs and the nursing notes.  Pertinent labs & imaging results that were available during my care of the patient were reviewed by me and considered in my medical decision making (see chart for details).    Assessment negative for red flags or concerns.  Hypertension treated with Coreg and lisinopril.  Osteoarthritis treated with duloxetine.  Refill sent.  Discussed importance of follow-up with primary care provider for long-term management of medical problems.  PCP assistance started.   Final Clinical Impressions(s) / UC Diagnoses   Final diagnoses:  Osteoarthritis of both knees,  unspecified osteoarthritis type  Essential hypertension  Medication refill     Discharge Instructions      Take your medications as prescribed.   Someone should be reaching out to help get you set up with a primary care provider.  It is important to get established with a primary care provider for any additional refills and long term management of your medical care.      ED Prescriptions     Medication Sig Dispense Auth. Provider   carvedilol (COREG) 12.5 MG tablet Take 1 tablet (12.5 mg total) by mouth 2 (two) times daily with a meal. 180 tablet Pearson Forster, NP   DULoxetine (CYMBALTA) 60 MG capsule Take 1 capsule (60 mg total) by mouth daily. 90 capsule Caroll Rancher R, NP   lisinopril (ZESTRIL) 40 MG tablet Take 1 tablet (40 mg total) by mouth daily. 90 tablet Pearson Forster, NP      PDMP not reviewed this encounter.   Pearson Forster, NP 09/07/20 1002

## 2020-09-07 NOTE — ED Triage Notes (Signed)
Pt presents for medication refill.

## 2020-09-07 NOTE — Discharge Instructions (Addendum)
Take your medications as prescribed.   Someone should be reaching out to help get you set up with a primary care provider.  It is important to get established with a primary care provider for any additional refills and long term management of your medical care.

## 2020-11-03 NOTE — Patient Instructions (Addendum)
Recommendations from today's visit: We will get some basic labs today for monitoring. Please share these with rheumatology if they would like to see them and to avoid duplicate blood work.  If you have any needs between now and next year, please let us know.   Information on diet, exercise, and health maintenance recommendations are listed below. This is information to help you be sure you are on track for optimal health and monitoring.   Please look over this and let us know if you have any questions or if you have completed any of the health maintenance outside of [place] so that we can be sure your records are up to date.  ___________________________________________________________  Thank you for choosing [place] [facility] for your Primary Care needs. I am excited for the opportunity to partner with you to meet your health care goals. It was a pleasure meeting you today!  I am an Adult-Geriatric Nurse Practitioner with a background in caring for patients for more than 20 years. I provide primary care and sports medicine services to patients age 15 and older within this office. I am also the director of the APP Fellowship with [facility].   I am passionate about providing the best service to you through preventive medicine and supportive care. I consider you a part of the medical team and value your input. I work diligently to ensure that you are heard and your needs are met in a safe and effective manner. I want you to feel comfortable with me as your provider and want you to know that your health concerns are important to me.  For your information, our office hours are Monday- Friday 8:00 AM - 5:00 PM At this time I am not in the office on Wednesdays.  If you have questions or concerns, please call our office at [phone] or send [place] a MyChart message and we will respond as quickly as possible.   For all urgent or time sensitive needs we ask that you please  call the office to avoid delays. MyChart is not constantly monitored and replies may take up to 72 business hours.  MyChart Policy: MyChart allows for you to see your visit notes, after visit summary, provider recommendations, lab and tests results, make an appointment, request refills, and contact your provider or the office for non-urgent questions or concerns. Providers are seeing patients during normal business hours and do not have built in time to review MyChart messages.  We ask that you allow a minimum of 4 business days for responses to [employer]. For this reason, please do not send urgent requests through [place]. Please call the office at [phone]. Complex MyChart concerns may require a visit. Your provider may request you schedule a virtual or in person visit to ensure we are providing the best care possible. MyChart messages sent after 4:00 PM on Friday will not be received by the provider until Monday morning.    Lab and Test Results: You will receive your lab and test results on MyChart as soon as they are completed and results have been sent by the lab or testing facility. Due to this service, you will receive your results BEFORE your provider.  I review lab and tests results each morning prior to seeing patients. Some results require collaboration with other providers to ensure you are receiving the most appropriate care. For this reason, we ask that you please allow a minimum of 4 business days for your provider to  receive and review lab and test results and contact you about these.  Most lab and test result comments from the provider will be sent through [facility]. Your provider may recommend changes to the plan of care, follow-up visits, repeat testing, ask questions, or request an office visit to discuss these results. You may reply directly to this message or call the office at [phone] to provide information for the provider or set up an appointment. In some instances,  you will be called with test results and recommendations. Please let us know if this is preferred and we will make note of this in your chart to provide this for you.    If you have not heard a response to your lab or test results in 72 business hours, please call the office to let us know.   After Hours: For all non-emergency after hours needs, please call the office at [phone] and select the option to reach the on-call provider service. On-call services are shared between multiple [place] offices and therefore it will not be possible to speak directly with your provider. On-call providers may provide medical advice and recommendations, but are unable to provide refills for maintenance medications.  For all emergency or urgent medical needs after normal business hours, we recommend that you seek care at the closest Urgent Care or Emergency Department to ensure appropriate treatment in a timely manner.  [facility] [place] has a 24 hour emergency room located on the ground floor for your convenience.    Please do not hesitate to reach out to [place] with concerns.   Thank you, again, for choosing me as your health care partner. I appreciate your trust and look forward to learning more about you.   [name], DNP, AGNP-c ___________________________________________________________  Health Maintenance Recommendations Screening Testing Mammogram Every 1 -2 years based on history and risk factors Starting at age 21 Pap Smear Ages 21-39 every 3 years Ages 56-65 every 5 years with HPV testing More frequent testing may be required based on results and history Colon Cancer Screening Every 1-10 years based on test performed, risk factors, and history Starting at age 58 Bone Density Screening Every 2-10 years based on history Starting at age 77 for women Recommendations for men differ based on medication usage, history, and risk factors AAA Screening One time  ultrasound Men 68-84 years old who have every smoked Lung Cancer Screening Low Dose Lung CT every 12 months Age 79-80 years with a 30 pack-year smoking history who still smoke or who have quit within the last 15 years  Screening Labs Routine  Labs: Complete Blood Count (CBC), Complete Metabolic Panel (CMP), Cholesterol (Lipid Panel) Every 6-12 months based on history and medications May be recommended more frequently based on current conditions or previous results Hemoglobin A1c Lab Every 3-12 months based on history and previous results Starting at age 22 or earlier with diagnosis of diabetes, high cholesterol, BMI >26, and/or risk factors Frequent monitoring for patients with diabetes to ensure blood sugar control Thyroid Panel (TSH w/ T3 & T4) Every 6 months based on history, symptoms, and risk factors May be repeated more often if on medication HIV One time testing for all patients 14 and older May be repeated more frequently for patients with increased risk factors or exposure Hepatitis C One time testing for all patients 75 and older May be repeated more frequently for patients with increased risk factors or exposure Gonorrhea, Chlamydia Every 12 months for all sexually active persons  13-24 years Additional monitoring may be recommended for those who are considered high risk or who have symptoms PSA Men 43-56 years old with risk factors Additional screening may be recommended from age 73-69 based on risk factors, symptoms, and history  Vaccine Recommendations Tetanus Booster All adults every 10 years Flu Vaccine All patients 6 months and older every year COVID Vaccine All patients 12 years and older Initial dosing with booster May recommend additional booster based on age and health history HPV Vaccine 2 doses all patients age 20-26 Dosing may be considered for patients over 26 Shingles Vaccine (Shingrix) 2 doses all adults 82 years and older Pneumonia (Pneumovax  23) All adults 25 years and older May recommend earlier dosing based on health history Pneumonia (Prevnar 50) All adults 59 years and older Dosed 1 year after Pneumovax 23  Additional Screening, Testing, and Vaccinations may be recommended on an individualized basis based on family history, health history, risk factors, and/or exposure.  __________________________________________________________  Diet Recommendations for All Patients  I recommend that all patients maintain a diet low in saturated fats, carbohydrates, and cholesterol. While this can be challenging at first, it is not impossible and small changes can make big differences.  Things to try: Decreasing the amount of soda, sweet tea, and/or juice to one or less per day and replace with water While water is always the first choice, if you do not like water you may consider adding a water additive without sugar to improve the taste other sugar free drinks Replace potatoes with a brightly colored vegetable at dinner Use healthy oils, such as canola oil or olive oil, instead of butter or hard margarine Limit your bread intake to two pieces or less a day Replace regular pasta with low carb pasta options Bake, broil, or grill foods instead of frying Monitor portion sizes  Eat smaller, more frequent meals throughout the day instead of large meals  An important thing to remember is, if you love foods that are not great for your health, you don't have to give them up completely. Instead, allow these foods to be a reward when you have done well. Allowing yourself to still have special treats every once in a while is a nice way to tell yourself thank you for working hard to keep yourself healthy.   Also remember that every day is a new day. If you have a bad day and "fall off the wagon", you can still climb right back up and keep moving along on your journey!  We have resources available to help you!  Some websites that may be helpful  include: www.http://carter.biz/  Www.VeryWellFit.com _____________________________________________________________  Activity Recommendations for All Patients  I recommend that all adults get at least 20 minutes of moderate physical activity that elevates your heart rate at least 5 days out of the week.  Some examples include: Walking or jogging at a pace that allows you to carry on a conversation Cycling (stationary bike or outdoors) Water aerobics Yoga Weight lifting Dancing If physical limitations prevent you from putting stress on your joints, exercise in a pool or seated in a chair are excellent options.  Do determine your MAXIMUM heart rate for activity: YOUR AGE - 220 = MAX HeartRate   Remember! Do not push yourself too hard.  Start slowly and build up your pace, speed, weight, time in exercise, etc.  Allow your body to rest between exercise and get good sleep. You will need more water than normal when you are  exerting yourself. Do not wait until you are thirsty to drink. Drink with a purpose of getting in at least 8, 8 ounce glasses of water a day plus more depending on how much you exercise and sweat.    If you begin to develop dizziness, chest pain, abdominal pain, jaw pain, shortness of breath, headache, vision changes, lightheadedness, or other concerning symptoms, stop the activity and allow your body to rest. If your symptoms are severe, seek emergency evaluation immediately. If your symptoms are concerning, but not severe, please let us know so that we can recommend further evaluation.   ________________________________________________________________

## 2020-11-03 NOTE — Progress Notes (Signed)
Orma Render, DNP, AGNP-c Primary Care & Sports Medicine [address]  [place], [place] [address] [phone]  New patient visit   Patient: Diane Clark   DOB: Oct 01, 1960   60 y.o. Female  MRN: [id number] Visit Date: 11/04/2020  Patient Care Team: Adison Jerger, Coralee Pesa, NP as PCP - General (Nurse Practitioner)  Today's healthcare provider: Orma Render, NP   Chief Complaint  Patient presents with   [place]    Diane Clark is a 60 y.o. female who presents today as a new patient to establish care.  HPI  History of HTN, retroperitoneal fibrosis.  Last CPE and labs: last year  She sees urology (evaluation earlier this year- follows-up annually) and rheumatology [facility]- sees November 2nd). Sees Dr. Gaetano Net for GYN with [facility]- next appt in December.  She had surgery previous to help with retroperitoneal fibrosis.  Colonoscopy in 2021, but cologuard recommended for future evaluations due to anatomical structure of colon. [facility] performed this.   She is due for the Shingles vaccine.  Had her last COVID booster on Wednesday of this week. She started the series with Moderna but her last booster was with [employer]. She tolerated all of these vaccines well.  She has had her flu shot this year.  Her last tetanus shot was about 5 years ago  She is taking duloxetine for pain management of joint pain. She reports this is working well for her.  She is on lisinopril and carvedilol for htn and this works well for her.    Past Medical History:  Diagnosis Date   Cellulitis 03/2017   Right ear   Hydronephrosis    Bilateral   Hypertension    OA (osteoarthritis) rheumotologist-  dr a. Trudie Reed   knees, shoulders   Obesity    Retroperitoneal fibrosis    on Methotrexate for   Seizures ([place])    with fever. Last one age 51   Ureteral obstruction    bilaterally--- secondary to hx retroperitoneal fibroids--  treatment indwelling ureteral stents   Uterine fibroid    Past Surgical History:  Procedure Laterality Date   CERVICAL CONE BIOPSY  1990   CYSTOSCOPY W/ URETERAL STENT PLACEMENT Bilateral 01/02/2017   Procedure: CYSTOSCOPY WITH STENT REPLACEMENT;  Surgeon: Ceasar Mons, MD;  Location: [facility];  Service: Urology;  Laterality: Bilateral;  ONLY NEEDS 30 MIN   CYSTOSCOPY W/ URETERAL STENT PLACEMENT Bilateral 08/09/2017   Procedure: CYSTOSCOPY WITH Aura Dials;  Surgeon: Ceasar Mons, MD;  Location: [facility];  Service: Urology;  Laterality: Bilateral;  ONLY NEEDS 30 MIN   CYSTOSCOPY W/ URETERAL STENT PLACEMENT Bilateral 11/22/2017   Procedure: CYSTOSCOPY WITH RETROGRADE PYELOGRAM/URETERAL STENT PLACEMENT;  Surgeon: Ceasar Mons, MD;  Location: [facility];  Service: Urology;  Laterality: Bilateral;   CYSTOSCOPY WITH RETROGRADE PYELOGRAM, URETEROSCOPY AND STENT PLACEMENT Bilateral 11/09/2016   Procedure: CYSTOSCOPY WITH RETROGRADE PYELOGRAM, BILATERAL URETEROSCOPY,   AND BILATERAL STENT PLACEMENT;  Surgeon: Ceasar Mons, MD;  Location: [facility];  Service: Urology;  Laterality: Bilateral;   CYSTOSCOPY WITH STENT PLACEMENT Bilateral 04/17/2017   Procedure: CYSTOSCOPY/ RETORGRADE/ STENT EXCHANGE;  Surgeon: Ceasar Mons, MD;  Location: [facility];  Service: Urology;  Laterality: Bilateral;  ONLY NEEDS 30 MIN   ROBOTIC XI URETEROLYSIS     March 2020   Family Status  Relation Name Status   Mother  Deceased  Father  Alive   Sister  Alive   MGM  Deceased   MGF  Deceased   PGM  Deceased   PGF  Deceased   Brother  (Not Specified)   Neg Hx  (Not Specified)   Family History  Problem Relation Age of Onset   Kidney failure Mother    Diabetes Mother    Heart disease Mother    Kidney failure Sister        kidney transplant   Hypertension Sister    Pancreatic  cancer Brother    Colon cancer Neg Hx    Esophageal cancer Neg Hx    Rectal cancer Neg Hx    Stomach cancer Neg Hx    Social History   Socioeconomic History   Marital status: Single    Spouse name: Not on file   Number of children: 0   Years of education: Not on file   Highest education level: Not on file  Occupational History   Occupation: Multimedia programmer: [employer]  Tobacco Use   Smoking status: Former    Packs/day: 1.00    Years: 10.00    Pack years: 10.00    Types: Cigarettes    Quit date: 12/14/1986    Years since quitting: 33.9   Smokeless tobacco: Never  Vaping Use   Vaping Use: Never used  Substance and Sexual Activity   Alcohol use: Yes    Comment: occ wine   Drug use: No   Sexual activity: Not on file  Other Topics Concern   Not on file  Social History Narrative   Single no kids   Works at [employer]   2 EtOH/day   No drugs/tobacco   4 caffeine/day      12/13/2016   Social Determinants of Health   Financial Resource Strain: Not on file  Food Insecurity: Not on file  Transportation Needs: Not on file  Physical Activity: Not on file  Stress: Not on file  Social Connections: Not on file   Outpatient Medications Prior to Visit  Medication Sig   [DISCONTINUED] folic acid (FOLVITE) 1 MG tablet Take 1 mg by mouth daily.   acetaminophen (TYLENOL) 500 MG tablet Take 500 mg by mouth every 6 (six) hours as needed for moderate pain.   cholecalciferol (VITAMIN D3) 25 MCG (1000 UNIT) tablet Take 1,000 Units by mouth daily.   folic acid (FOLVITE) 025 MCG tablet Take 400 mcg by mouth daily.   methotrexate 2.5 MG tablet Take 2.5 mg by mouth once a week. Takes 6 of the 2.5 mg pills on wednesday   Multiple Vitamin (MULTIVITAMIN) tablet Take 1 tablet by mouth daily.   [DISCONTINUED] carvedilol (COREG) 12.5 MG tablet Take 1 tablet (12.5 mg total) by mouth 2 (two) times daily with a meal.    [DISCONTINUED] DULoxetine (CYMBALTA) 60 MG capsule Take 1 capsule (60 mg total) by mouth daily.   [DISCONTINUED] lisinopril (ZESTRIL) 40 MG tablet Take 1 tablet (40 mg total) by mouth daily.   No facility-administered medications prior to visit.   Allergies  Allergen Reactions   Lidocaine Swelling    Pt states " the site where local lidocaine placed-swelling occurred briefly"    Immunization History  Administered Date(s) Administered   H1N1 01/26/2008   Influenza,inj,Quad PF,6+ Mos 09/22/2015, 10/24/2016, 09/09/2017, 10/29/2017   Influenza-Unspecified 10/19/2019, 10/14/2020   Moderna Sars-Covid-2 Vaccination 03/13/2019, 04/10/2019   PFIZER(Purple Top)SARS-COV-2 Vaccination 11/02/2020   Td 08/16/1998, 01/26/2008  Tdap 10/17/2015    Health Maintenance  Topic Date Due   Pneumococcal Vaccine 25-24 Years old (1 - PCV) Never done   Zoster Vaccines- Shingrix (1 of 2) Never done   Fecal DNA (Cologuard)  Never done   MAMMOGRAM  12/29/2020 (Originally 08/31/2019)   COVID-19 Vaccine (4 - Booster) 12/28/2020   PAP SMEAR-Modifier  10/24/2021   TETANUS/TDAP  10/16/2025   INFLUENZA VACCINE  Completed   Hepatitis C Screening  Completed   HIV Screening  Completed   HPV VACCINES  Aged Out    Patient Care Team: Vaughan Garfinkle, Coralee Pesa, NP as PCP - General (Nurse Practitioner)  Review of Systems All review of systems negative except what is listed in the HPI    Objective    BP 117/75   Pulse 74   Ht 5' 8"  (1.727 m)   Wt 211 lb 8 oz (95.9 kg)   LMP 03/08/2010   SpO2 100%   BMI 32.16 kg/m  Physical Exam Vitals and nursing note reviewed.  Constitutional:      General: She is not in acute distress.    Appearance: Normal appearance.  Eyes:     Extraocular Movements: Extraocular movements intact.     Conjunctiva/sclera: Conjunctivae normal.     Pupils: Pupils are equal, round, and reactive to light.  Neck:     Vascular: No carotid bruit.  Cardiovascular:     Rate and Rhythm: Normal rate  and regular rhythm.     Pulses: Normal pulses.     Heart sounds: Normal heart sounds. No murmur heard. Pulmonary:     Effort: Pulmonary effort is normal.     Breath sounds: Normal breath sounds. No wheezing.  Abdominal:     General: Bowel sounds are normal.     Palpations: Abdomen is soft.  Musculoskeletal:        General: Swelling and tenderness present. Normal range of motion.     Cervical back: Normal range of motion.     Right lower leg: No edema.     Left lower leg: No edema.  Skin:    General: Skin is warm and dry.     Capillary Refill: Capillary refill takes less than 2 seconds.  Neurological:     General: No focal deficit present.     Mental Status: She is alert and oriented to person, place, and time.  Psychiatric:        Mood and Affect: Mood normal.        Behavior: Behavior normal.        Thought Content: Thought content normal.        Judgment: Judgment normal.     Depression Screen PHQ 2/9 Scores 11/04/2020 10/19/2019 06/03/2019 09/09/2017  PHQ - 2 Score 0 0 0 0  PHQ- 9 Score 0 0 - -   Results for orders placed or performed in visit on 11/04/20  CBC With Differential  Result Value Ref Range   WBC 5.0 3.4 - 10.8 x10E3/uL   RBC 4.33 3.77 - 5.28 x10E6/uL   Hemoglobin 12.8 11.1 - 15.9 g/dL   Hematocrit 38.9 34.0 - 46.6 %   MCV 90 79 - 97 fL   MCH 29.6 26.6 - 33.0 pg   MCHC 32.9 31.5 - 35.7 g/dL   RDW 13.5 11.7 - 15.4 %   Neutrophils 50 Not Estab. %   Lymphs 37 Not Estab. %   Monocytes 8 Not Estab. %   Eos 4 Not Estab. %   Basos 1  Not Estab. %   Neutrophils Absolute 2.5 1.4 - 7.0 x10E3/uL   Lymphocytes Absolute 1.8 0.7 - 3.1 x10E3/uL   Monocytes Absolute 0.4 0.1 - 0.9 x10E3/uL   EOS (ABSOLUTE) 0.2 0.0 - 0.4 x10E3/uL   Basophils Absolute 0.0 0.0 - 0.2 x10E3/uL   Immature Granulocytes 0 Not Estab. %   Immature Grans (Abs) 0.0 0.0 - 0.1 x10E3/uL  Comprehensive metabolic panel  Result Value Ref Range   Glucose 86 70 - 99 mg/dL   BUN 19 8 - 27 mg/dL    Creatinine, Ser 0.62 0.57 - 1.00 mg/dL   eGFR 101 >59 mL/min/1.73   BUN/Creatinine Ratio 31 (H) 12 - 28   Sodium 138 134 - 144 mmol/L   Potassium 4.2 3.5 - 5.2 mmol/L   Chloride 101 96 - 106 mmol/L   CO2 23 20 - 29 mmol/L   Calcium 9.0 8.7 - 10.3 mg/dL   Total Protein 6.9 6.0 - 8.5 g/dL   Albumin 4.4 3.8 - 4.8 g/dL   Globulin, Total 2.5 1.5 - 4.5 g/dL   Albumin/Globulin Ratio 1.8 1.2 - 2.2   Bilirubin Total 0.3 0.0 - 1.2 mg/dL   Alkaline Phosphatase 88 44 - 121 IU/L   AST 25 0 - 40 IU/L   ALT 25 0 - 32 IU/L  Lipid panel  Result Value Ref Range   Cholesterol, Total 221 (H) 100 - 199 mg/dL   Triglycerides 47 0 - 149 mg/dL   HDL 88 >39 mg/dL   VLDL Cholesterol Cal 8 5 - 40 mg/dL   LDL Chol Calc (NIH) 125 (H) 0 - 99 mg/dL   Chol/HDL Ratio 2.5 0.0 - 4.4 ratio  TSH  Result Value Ref Range   TSH 1.040 0.450 - 4.500 uIU/mL  Hemoglobin A1c  Result Value Ref Range   Hgb A1c MFr Bld 5.7 (H) 4.8 - 5.6 %   Est. average glucose Bld gHb Est-mCnc 117 mg/dL  VITAMIN D 25 Hydroxy (Vit-D Deficiency, Fractures)  Result Value Ref Range   Vit D, 25-Hydroxy 52.7 30.0 - 100.0 ng/mL    Assessment & Plan      Problem List Items Addressed This Visit     Osteoarthritis of knees, bilateral    Cymbalta working well No worsening or new symptoms present.  Will plan to continue with gentle stretching and exercises to help maintain flexibility and reduce pain      Relevant Medications   DULoxetine (CYMBALTA) 60 MG capsule   Other Relevant Orders   VITAMIN D 25 Hydroxy (Vit-D Deficiency, Fractures) (Completed)   Essential hypertension    BP well controlled today. Continue current regimen Will check labs today      Relevant Medications   lisinopril (ZESTRIL) 40 MG tablet   carvedilol (COREG) 12.5 MG tablet   Retroperitoneal fibrosis    Seeing urology for management. No concerns at this time, but will obtain labs for review.       Encounter for medical examination to establish care -  Primary    Review of current and past medical history, social history, medication, and family history.  Review of care gaps and health maintenance recommendations.  Records from recent providers to be requested if not available in Chart Review or Care Everywhere.  Recommendations for health maintenance, diet, and exercise provided.  Labs today: CMP, CBC, Lipid, TSH, VitD, A1c HM Recommendations: cologuard, zoster, flu CPE due: 1 year       Other Visit Diagnoses     Screening for colon  cancer       Relevant Orders   Cologuard   Screening for lipid disorders       Relevant Orders   Lipid panel (Completed)   Screening for thyroid disorder       Relevant Orders   TSH (Completed)   Screening for endocrine, nutritional, metabolic and immunity disorder       Relevant Orders   CBC With Differential (Completed)   Comprehensive metabolic panel (Completed)   Lipid panel (Completed)   TSH (Completed)   Hemoglobin A1c (Completed)   VITAMIN D 25 Hydroxy (Vit-D Deficiency, Fractures) (Completed)   H/O vitamin D deficiency       Relevant Orders   VITAMIN D 25 Hydroxy (Vit-D Deficiency, Fractures) (Completed)        Return in about 1 year (around 11/04/2021) for CPE with labs.      Nayelly Laughman, Coralee Pesa, NP, DNP, AGNP-C Primary Care & Sports Medicine at [place]

## 2020-11-04 ENCOUNTER — Other Ambulatory Visit: Payer: Self-pay

## 2020-11-04 ENCOUNTER — Ambulatory Visit (INDEPENDENT_AMBULATORY_CARE_PROVIDER_SITE_OTHER): Payer: 59 | Admitting: Nurse Practitioner

## 2020-11-04 ENCOUNTER — Encounter (HOSPITAL_BASED_OUTPATIENT_CLINIC_OR_DEPARTMENT_OTHER): Payer: Self-pay | Admitting: Nurse Practitioner

## 2020-11-04 VITALS — BP 117/75 | HR 74 | Ht 68.0 in | Wt 211.5 lb

## 2020-11-04 DIAGNOSIS — Z8639 Personal history of other endocrine, nutritional and metabolic disease: Secondary | ICD-10-CM

## 2020-11-04 DIAGNOSIS — Z0000 Encounter for general adult medical examination without abnormal findings: Secondary | ICD-10-CM | POA: Diagnosis not present

## 2020-11-04 DIAGNOSIS — Z1321 Encounter for screening for nutritional disorder: Secondary | ICD-10-CM

## 2020-11-04 DIAGNOSIS — Z1329 Encounter for screening for other suspected endocrine disorder: Secondary | ICD-10-CM

## 2020-11-04 DIAGNOSIS — Z1211 Encounter for screening for malignant neoplasm of colon: Secondary | ICD-10-CM

## 2020-11-04 DIAGNOSIS — N135 Crossing vessel and stricture of ureter without hydronephrosis: Secondary | ICD-10-CM

## 2020-11-04 DIAGNOSIS — M170 Bilateral primary osteoarthritis of knee: Secondary | ICD-10-CM | POA: Diagnosis not present

## 2020-11-04 DIAGNOSIS — Z130 Encounter for screening for diseases of the blood and blood-forming organs and certain disorders involving the immune mechanism: Secondary | ICD-10-CM

## 2020-11-04 DIAGNOSIS — I10 Essential (primary) hypertension: Secondary | ICD-10-CM | POA: Diagnosis not present

## 2020-11-04 DIAGNOSIS — Z13228 Encounter for screening for other metabolic disorders: Secondary | ICD-10-CM

## 2020-11-04 DIAGNOSIS — Z13220 Encounter for screening for lipoid disorders: Secondary | ICD-10-CM

## 2020-11-04 MED ORDER — CARVEDILOL 12.5 MG PO TABS: 12.5000 mg | ORAL_TABLET | Freq: Two times a day (BID) | ORAL | 3 refills | Status: DC

## 2020-11-04 MED ORDER — DULOXETINE HCL 60 MG PO CPEP: 60.0000 mg | ORAL_CAPSULE | Freq: Every day | ORAL | 3 refills | Status: DC

## 2020-11-04 MED ORDER — LISINOPRIL 40 MG PO TABS: 40.0000 mg | ORAL_TABLET | Freq: Every day | ORAL | 3 refills | Status: DC

## 2020-11-05 LAB — COMPREHENSIVE METABOLIC PANEL
ALT: 25 IU/L (ref 0–32)
AST: 25 IU/L (ref 0–40)
Albumin/Globulin Ratio: 1.8 (ref 1.2–2.2)
Albumin: 4.4 g/dL (ref 3.8–4.8)
Alkaline Phosphatase: 88 IU/L (ref 44–121)
BUN/Creatinine Ratio: 31 — ABNORMAL HIGH (ref 12–28)
BUN: 19 mg/dL (ref 8–27)
Bilirubin Total: 0.3 mg/dL (ref 0.0–1.2)
CO2: 23 mmol/L (ref 20–29)
Calcium: 9 mg/dL (ref 8.7–10.3)
Chloride: 101 mmol/L (ref 96–106)
Creatinine, Ser: 0.62 mg/dL (ref 0.57–1.00)
Globulin, Total: 2.5 g/dL (ref 1.5–4.5)
Glucose: 86 mg/dL (ref 70–99)
Potassium: 4.2 mmol/L (ref 3.5–5.2)
Sodium: 138 mmol/L (ref 134–144)
Total Protein: 6.9 g/dL (ref 6.0–8.5)
eGFR: 101 mL/min/{1.73_m2} (ref >59)

## 2020-11-05 LAB — CBC WITH DIFFERENTIAL
Basophils Absolute: 0 10*3/uL (ref 0.0–0.2)
Basos: 1 %
EOS (ABSOLUTE): 0.2 10*3/uL (ref 0.0–0.4)
Eos: 4 %
Hematocrit: 38.9 % (ref 34.0–46.6)
Hemoglobin: 12.8 g/dL (ref 11.1–15.9)
Immature Grans (Abs): 0 10*3/uL (ref 0.0–0.1)
Immature Granulocytes: 0 %
Lymphocytes Absolute: 1.8 10*3/uL (ref 0.7–3.1)
Lymphs: 37 %
MCH: 29.6 pg (ref 26.6–33.0)
MCHC: 32.9 g/dL (ref 31.5–35.7)
MCV: 90 fL (ref 79–97)
Monocytes Absolute: 0.4 10*3/uL (ref 0.1–0.9)
Monocytes: 8 %
Neutrophils Absolute: 2.5 10*3/uL (ref 1.4–7.0)
Neutrophils: 50 %
RBC: 4.33 x10E6/uL (ref 3.77–5.28)
RDW: 13.5 % (ref 11.7–15.4)
WBC: 5 10*3/uL (ref 3.4–10.8)

## 2020-11-05 LAB — VITAMIN D 25 HYDROXY (VIT D DEFICIENCY, FRACTURES): Vit D, 25-Hydroxy: 52.7 ng/mL (ref 30.0–100.0)

## 2020-11-05 LAB — LIPID PANEL
Chol/HDL Ratio: 2.5 ratio (ref 0.0–4.4)
Cholesterol, Total: 221 mg/dL — ABNORMAL HIGH (ref 100–199)
HDL: 88 mg/dL
LDL Chol Calc (NIH): 125 mg/dL — ABNORMAL HIGH (ref 0–99)
Triglycerides: 47 mg/dL (ref 0–149)
VLDL Cholesterol Cal: 8 mg/dL (ref 5–40)

## 2020-11-05 LAB — HEMOGLOBIN A1C
Est. average glucose Bld gHb Est-mCnc: 117 mg/dL
Hgb A1c MFr Bld: 5.7 % — ABNORMAL HIGH (ref 4.8–5.6)

## 2020-11-05 LAB — TSH: TSH: 1.04 u[IU]/mL (ref 0.450–4.500)

## 2020-11-08 NOTE — Progress Notes (Signed)
Called pt and scheduled appt

## 2020-11-10 NOTE — Assessment & Plan Note (Signed)
Seeing urology for management. No concerns at this time, but will obtain labs for review.

## 2020-11-10 NOTE — Assessment & Plan Note (Signed)
Review of current and past medical history, social history, medication, and family history.  Review of care gaps and health maintenance recommendations.  Records from recent providers to be requested if not available in Chart Review or Care Everywhere.  Recommendations for health maintenance, diet, and exercise provided.  Labs today: CMP, CBC, Lipid, TSH, VitD, A1c HM Recommendations: cologuard, zoster, flu CPE due: 1 year

## 2020-11-10 NOTE — Assessment & Plan Note (Signed)
Cymbalta working well No worsening or new symptoms present.  Will plan to continue with gentle stretching and exercises to help maintain flexibility and reduce pain

## 2020-11-10 NOTE — Assessment & Plan Note (Signed)
BP well controlled today. Continue current regimen Will check labs today

## 2020-11-25 LAB — COLOGUARD: COLOGUARD: NEGATIVE

## 2020-11-28 NOTE — Progress Notes (Signed)
Cologuard results are normal. We will plan to repeat in 3 years unless there are any issues.   Have a great day! SaraBeth

## 2021-05-09 ENCOUNTER — Ambulatory Visit (INDEPENDENT_AMBULATORY_CARE_PROVIDER_SITE_OTHER): Payer: 59 | Admitting: Nurse Practitioner

## 2021-05-09 ENCOUNTER — Encounter (HOSPITAL_BASED_OUTPATIENT_CLINIC_OR_DEPARTMENT_OTHER): Payer: Self-pay | Admitting: Nurse Practitioner

## 2021-05-09 VITALS — BP 122/82 | HR 74 | Temp 98.6°F | Ht 68.0 in | Wt 208.0 lb

## 2021-05-09 DIAGNOSIS — E785 Hyperlipidemia, unspecified: Secondary | ICD-10-CM | POA: Diagnosis not present

## 2021-05-09 DIAGNOSIS — E1165 Type 2 diabetes mellitus with hyperglycemia: Secondary | ICD-10-CM

## 2021-05-09 DIAGNOSIS — I10 Essential (primary) hypertension: Secondary | ICD-10-CM | POA: Diagnosis not present

## 2021-05-09 DIAGNOSIS — Z960 Presence of urogenital implants: Secondary | ICD-10-CM | POA: Insufficient documentation

## 2021-05-09 DIAGNOSIS — I776 Arteritis, unspecified: Secondary | ICD-10-CM | POA: Insufficient documentation

## 2021-05-09 DIAGNOSIS — R600 Localized edema: Secondary | ICD-10-CM | POA: Insufficient documentation

## 2021-05-09 DIAGNOSIS — Z79899 Other long term (current) drug therapy: Secondary | ICD-10-CM | POA: Insufficient documentation

## 2021-05-09 NOTE — Progress Notes (Signed)
? ?  Established Patient Office Visit ? ?Subjective   ?Patient ID: Diane Clark, female    DOB: 06-10-1960  Age: 61 y.o. MRN: [id number] ? ?Chief Complaint  ?Patient presents with  ? Follow-up  ?  Pre-DM, HTN  ? ? ?HPI ? ?Patient reports to clinic for follow up of pre-diabetes and hyperlipidemia.  ? ?Pre-DM: ?- Currently not taking medications A1c only slightly elevated. ? - States she has cut back on her Re[employer] candy intake ? - Reports regular aerobic exercise walking outside ? - Does not check BS at home currently ? - Requests few new A1c lab today ?HLD:  ? - Pt has lost ~ 4lbs  over last two visits ? - Currently not taking medications for cholesterol  ?- Reports wanting to make lifestyle modifications to improve health before resorting to medications ?Misc.:  ? - Up to date on COVID vaccine, card verified in office ?- Reports getting Shingles vaccine at pharmacy, verification requested ?- Reports receiving PNA vaccine ~2 years ago ?- Reports had eye exam within last few months and has follow up apt in next few weeks ?- Reports seeing dentist reguarly ?- Reports a previous colonoscopy GI said she has "floppy colon" and will benefit from [place] in future since that colonoscopy was benign  ? ? ? ?ROS ? ?  ?Objective:  ?  ? ?BP 122/82   Pulse 74   Temp 98.6 ?F (37 ?C)   Ht '5\' 8"'$  (1.727 m)   Wt 208 lb (94.3 kg)   LMP 03/08/2010   SpO2 97%   BMI 31.63 kg/m?  ? ? ?Physical Exam ? ? ?No results found for any visits on 05/09/21. ? ? ? ?The 10-year ASCVD risk score (Arnett DK, et al., 2019) is: 13.9% ? ?  ?Assessment & Plan:  ? ?Problem List Items Addressed This Visit   ? ?  ? Cardiovascular and Mediastinum  ? Essential hypertension  ? Relevant Orders  ? CBC with Differential/Platelet  ? Comprehensive metabolic panel  ? Lipid panel  ? Hemoglobin A1c  ? POCT UA - Microalbumin  ?  ? Endocrine  ? Type 2 diabetes mellitus with hyperglycemia, without long-term current use of insulin (HCC) - Primary  ? Relevant Orders   ? CBC with Differential/Platelet  ? Comprehensive metabolic panel  ? Lipid panel  ? Hemoglobin A1c  ? POCT UA - Microalbumin  ?  ? Other  ? Hyperlipidemia  ? Relevant Orders  ? CBC with Differential/Platelet  ? Comprehensive metabolic panel  ? Lipid panel  ? Hemoglobin A1c  ? POCT UA - Microalbumin  ? ? ?Return in about 6 months (around 11/08/2021) for Pre-DM, HTN, HLD.  ? ? ?Valinda Hoar, RN ? ?

## 2021-05-09 NOTE — Progress Notes (Deleted)
? ?  Established Patient Office Visit ? ?Subjective   ?Patient ID: Diane Clark, female    DOB: Oct 04, 1960  Age: 61 y.o. MRN: [id number] ? ?Chief Complaint  ?Patient presents with  ? Follow-up  ?  Pre-DM, HTN  ? ? ?HPI ? ?{History (Optional):23778} ? ?ROS ? ?  ?Objective:  ?  ? ?BP 122/82   Pulse 74   Temp 98.6 ?F (37 ?C)   Ht '5\' 8"'$  (1.727 m)   Wt 208 lb (94.3 kg)   LMP 03/08/2010   SpO2 97%   BMI 31.63 kg/m?  ?{Vitals History (Optional):23777} ? ?Physical Exam ? ? ?No results found for any visits on 05/09/21. ? ?{Labs (Optional):23779} ? ?The 10-year ASCVD risk score (Arnett DK, et al., 2019) is: 13.9% ? ?  ?Assessment & Plan:  ? ?Problem List Items Addressed This Visit   ? ? Type 2 diabetes mellitus with hyperglycemia, without long-term current use of insulin (HCC) - Primary  ? Relevant Orders  ? CBC with Differential/Platelet  ? Comprehensive metabolic panel  ? Lipid panel  ? Hemoglobin A1c  ? POCT UA - Microalbumin  ? Hyperlipidemia  ? Relevant Orders  ? CBC with Differential/Platelet  ? Comprehensive metabolic panel  ? Lipid panel  ? Hemoglobin A1c  ? POCT UA - Microalbumin  ? Essential hypertension  ? Relevant Orders  ? CBC with Differential/Platelet  ? Comprehensive metabolic panel  ? Lipid panel  ? Hemoglobin A1c  ? POCT UA - Microalbumin  ? ? ?Return in about 6 months (around 11/08/2021) for Pre-DM, HTN, HLD.  ? ? ?Orma Render, NP ? ?

## 2021-05-09 NOTE — Progress Notes (Signed)
?Diane Keeler, DNP, AGNP-c ?[facility] ?[address] ?[address] ?[place], [place] [address] ?[phone] Office [phone] Fax ? ?ESTABLISHED PATIENT- Chronic Health and/or Follow-Up Visit ? ?Blood pressure 122/82, pulse 74, temperature 98.6 ?F (37 ?C), height '5\' 8"'$  (1.727 m), weight 208 lb (94.3 kg), last menstrual period 03/08/2010, SpO2 97 %. ? ?Follow-up (Pre-DM, HTN) ? ? ?HPI ? ?Diane Clark  is a 61 y.o. year old female presenting today for evaluation and management of the following: ? ?Pre-DM: ?- Currently not taking medications A1c only slightly elevated. ?- States she has cut back on her [employer] candy intake ?- Reports regular aerobic exercise walking outside ?- Does not check BS at home currently ?- Requests few new A1c lab today ? ? HLD:  ? - Pt has lost ~ 4lbs  over last two visits ? - Currently not taking medications for cholesterol  ?- Reports wanting to make lifestyle modifications to improve health before resorting to medications ?Misc.:  ?            - Up to date on COVID vaccine, card verified in office ?- Reports getting Shingles vaccine at pharmacy, verification requested ?- Reports receiving PNA vaccine ~2 years ago ?- Reports had eye exam within last few months and has follow up apt in next few weeks ?- Reports seeing dentist reguarly ?- Reports a previous colonoscopy GI said she has "floppy colon" and will benefit from [place] in future since that colonoscopy was benign  ? ?ROS ?All ROS negative with exception of what is listed in HPI ? ?PHYSICAL EXAM ?Physical Exam ?Vitals and nursing note reviewed.  ?Constitutional:   ?   Appearance: Normal appearance.  ?HENT:  ?   Head: Normocephalic.  ?Eyes:  ?   Extraocular Movements: Extraocular movements intact.  ?   Conjunctiva/sclera: Conjunctivae normal.  ?   Pupils: Pupils are equal, round, and reactive to light.  ?Neck:  ?   Vascular: No carotid bruit.  ?Cardiovascular:  ?   Rate and Rhythm: Normal  rate and regular rhythm.  ?   Pulses: Normal pulses.  ?   Heart sounds: Normal heart sounds.  ?Pulmonary:  ?   Effort: Pulmonary effort is normal.  ?   Breath sounds: Normal breath sounds.  ?Abdominal:  ?   General: Bowel sounds are normal. There is no distension.  ?   Palpations: Abdomen is soft.  ?   Tenderness: There is no abdominal tenderness. There is no guarding.  ?Musculoskeletal:     ?   General: Normal range of motion.  ?   Right lower leg: No edema.  ?   Left lower leg: No edema.  ?Lymphadenopathy:  ?   Cervical: No cervical adenopathy.  ?Skin: ?   General: Skin is warm and dry.  ?   Capillary Refill: Capillary refill takes less than 2 seconds.  ?Neurological:  ?   General: No focal deficit present.  ?   Mental Status: She is alert and oriented to person, place, and time.  ?   Motor: No weakness.  ?Psychiatric:     ?   Mood and Affect: Mood normal.     ?   Behavior: Behavior normal.     ?   Thought Content: Thought content normal.     ?   Judgment: Judgment normal.  ? ? ?ASSESSMENT & PLAN ?Problem List Items Addressed This Visit   ? ? Essential hypertension  ?  Chronic.  Well-controlled today.  No alarm symptoms present at this time.  We will obtain labs for further evaluation today. ?Recommend continuation of carvedilol and lisinopril. ?Recommend low carbohydrate, low-fat, low-salt diet with increased exercise of at least 20 minutes daily and slowly increasing time and distance each week to a goal of 30 minutes at least 5 days a week. ?We will make changes to plan of care based on lab results as necessary. ?Plan to follow-up in 6 months or sooner if needed. ?We will need foot exam and urine microalbumin at next visit. ? ?  ?  ? Relevant Orders  ? CBC with Differential/Platelet (Completed)  ? Comprehensive metabolic panel (Completed)  ? Lipid panel (Completed)  ? Hemoglobin A1c (Completed)  ? POCT UA - Microalbumin  ? Type 2 diabetes mellitus with hyperglycemia, without long-term current use of insulin  (HCC) - Primary  ?  Chronic.  Will obtain labs today for evaluation.  Recently labs have been fairly stable with diet control only.  Patient does not wish to go on medication at this time but wishes to continue to manage with her diet. ?Education and discussion on risks associated with uncontrolled diabetes and importance of close management pain control discussed. ?She is up-to-date on her eye exam.  We will need to obtain these records to update her chart. ?We will make changes to plan of care based on lab findings today. ?Follow-up in approximately 6 months or sooner if needed. ? ?  ?  ? Relevant Orders  ? CBC with Differential/Platelet (Completed)  ? Comprehensive metabolic panel (Completed)  ? Lipid panel (Completed)  ? Hemoglobin A1c (Completed)  ? POCT UA - Microalbumin  ? Hyperlipidemia  ?  Chronic.  Not currently on statin therapy.  Patient declines this at this time.  Discussion on the importance of cholesterol management specifically in the setting of diabetes and hypertension as risks of cardiovascular degree disease are significantly increased with these comorbidities. ?Recommend continued diet and lifestyle modifications to help improve lipid levels. ?Recommend increase daily physical activity with a goal of at least 30 minutes of exercise at least 5 days a week. ?We will obtain labs today for further evaluation.  We will make changes to plan of care based on lab results. ?Plan to follow-up in 6 months for sooner if needed. ? ?  ?  ? Relevant Orders  ? CBC with Differential/Platelet (Completed)  ? Comprehensive metabolic panel (Completed)  ? Lipid panel (Completed)  ? Hemoglobin A1c (Completed)  ? POCT UA - Microalbumin  ? ? ? ? ?FOLLOW-UP ?Return in about 6 months (around 11/08/2021) for Pre-DM, HTN, HLD. ? ?  ?Patient seen along with NP student, Alexia Freestone at today's visit. Portions of documentation and assessment have been completed by the student listed.  ?I, Orma Render, NP, have reviewed all  documentation completed by the student. The documentation on 05/19/21 for the exam, diagnosis, procedures, and orders are all accurate and complete. ? ?I, Orma Render, Julious Oka personally seen and evaluated the patient during this encounter.  ?While the patient was in clinic, I reviewed the patient?s medical history, the student's findings on physical examination, and the patient?s diagnosis and treatment plan. All aspects of care were discussed with the the student and I agree with the information documented.  ? ?Karissa Meenan, Coralee Pesa, NP, DNp, AGNP-c ?Primary Care & Sports Medicine at [facility], [place] ?[facility]  ? ? ?Diane Keeler, DNP, AGNP-c ?05/09/2021  8:49 AM ?

## 2021-05-09 NOTE — Patient Instructions (Addendum)
It was a pleasure seeing you today. I hope your time spent with [place] was pleasant and helpful. Please let us know if there is anything we can do to improve the service you receive.  ? ?Today we discussed concerns with: ? ?Type 2 diabetes mellitus with hyperglycemia, without long-term current use of insulin ([place]) ? ?Essential hypertension ? ?Hyperlipidemia, unspecified hyperlipidemia type ? ?We will work to get the results of your mammogram, eye exam, and shingles vaccine to make sure we have your records all up to date.  ?We will get labs today to measure how things look. I am proud of you for your efforts to help control your blood sugars! ?Great Job!! ? ?The following orders have been placed for you today: ? ?No orders of the defined types were placed in this encounter. ? ? ? ?Important Office Information ?Lab Results ?If labs were ordered, please note that you will see results through [facility] as soon as they come available from [facility].  ?It takes up to 5 business days for the results to be routed to me and for me to review them once all of the lab results have come through from [facility]. I will make recommendations based on your results and send these through [facility] or someone from the office will call you to discuss. If your labs are abnormal, we may contact you to schedule a visit to discuss the results and make recommendations.  ?If you have not heard from [place] within 5 business days or you have waited longer than a week and your lab results have not come through on [facility], please feel free to call the office or send a message through [facility] to follow-up on these labs.  ? ?Referrals ?If referrals were placed today, the office where the referral was sent will contact you either by phone or through [facility] to set up scheduling. Please note that it can take up to a week for the referral office to contact you. If you do not hear from them in a week, please contact the referral office directly to inquire about  scheduling.  ? ?Condition Treated ?If your condition worsens or you begin to have new symptoms, please schedule a follow-up appointment for further evaluation. If you are not sure if an appointment is needed, you may call the office to leave a message for the nurse and someone will contact you with recommendations.  ?If you have an urgent or life threatening emergency, please do not call the office, but seek emergency evaluation by calling 911 or going to the nearest emergency room for evaluation.  ? ?MyChart and Phone Calls ?Please do not use MyChart for urgent messages. It may take up to 3 business days for MyChart messages to be read by staff and if they are unable to handle the request, an additional 3 business days for them to be routed to me and for my response.  ?Messages sent to the provider through [facility] do not come directly to the provider, please allow time for these messages to be routed and for me to respond.  ?We get a large volume of MyChart messages daily and these are responded to in the order received.  ? ?For urgent messages, please call the office at [phone] and speak with the front office staff or leave a message on the line of my assistant for guidance.  ?We are seeing patients from the hours of 8:00 am through 5:00 pm and calls directly to the nurse may not be answered  immediately due to seeing patients, but your call will be returned as soon as possible.  ?Phone  messages received after 4:00 PM Monday through Thursday may not be returned until the following business day. Phone messages received after 11:00 AM on Friday may not be returned until Monday.  ? ?After Hours ?We share on call hours with providers from other offices. If you have an urgent need after hours that cannot wait until the next business day, please contact the on call provider by calling the office number. A nurse will speak with you and contact the provider if needed for recommendations.  ?If you have an urgent or  life threatening emergency after hours, please do not call the on call provider, but seek emergency evaluation by calling 911 or going to the nearest emergency room for evaluation.  ? ?Paperwork ?All paperwork requires a minimum of 5 days to complete and return to you or the designated personnel. Please keep this in mind when bringing in forms or sending requests for paperwork completion to the office.  ?  ?

## 2021-05-10 LAB — CBC WITH DIFFERENTIAL/PLATELET
Basophils Absolute: 0 10*3/uL (ref 0.0–0.2)
Basos: 0 %
EOS (ABSOLUTE): 0.2 10*3/uL (ref 0.0–0.4)
Eos: 4 %
Hematocrit: 40.7 % (ref 34.0–46.6)
Hemoglobin: 13.7 g/dL (ref 11.1–15.9)
Immature Grans (Abs): 0 10*3/uL (ref 0.0–0.1)
Immature Granulocytes: 0 %
Lymphocytes Absolute: 1.6 10*3/uL (ref 0.7–3.1)
Lymphs: 34 %
MCH: 29.8 pg (ref 26.6–33.0)
MCHC: 33.7 g/dL (ref 31.5–35.7)
MCV: 89 fL (ref 79–97)
Monocytes Absolute: 0.3 10*3/uL (ref 0.1–0.9)
Monocytes: 6 %
Neutrophils Absolute: 2.5 10*3/uL (ref 1.4–7.0)
Neutrophils: 56 %
Platelets: 261 10*3/uL (ref 150–450)
RBC: 4.6 x10E6/uL (ref 3.77–5.28)
RDW: 13.1 % (ref 11.7–15.4)
WBC: 4.6 10*3/uL (ref 3.4–10.8)

## 2021-05-10 LAB — LIPID PANEL
Chol/HDL Ratio: 2.9 ratio (ref 0.0–4.4)
Cholesterol, Total: 249 mg/dL — ABNORMAL HIGH (ref 100–199)
HDL: 85 mg/dL (ref >39)
LDL Chol Calc (NIH): 157 mg/dL — ABNORMAL HIGH (ref 0–99)
Triglycerides: 48 mg/dL (ref 0–149)
VLDL Cholesterol Cal: 7 mg/dL (ref 5–40)

## 2021-05-10 LAB — COMPREHENSIVE METABOLIC PANEL
ALT: 18 IU/L (ref 0–32)
AST: 16 IU/L (ref 0–40)
Albumin/Globulin Ratio: 1.6 (ref 1.2–2.2)
Albumin: 4.4 g/dL (ref 3.8–4.8)
Alkaline Phosphatase: 93 IU/L (ref 44–121)
BUN/Creatinine Ratio: 18 (ref 12–28)
BUN: 12 mg/dL (ref 8–27)
Bilirubin Total: <0.2 mg/dL (ref 0.0–1.2)
CO2: 25 mmol/L (ref 20–29)
Calcium: 9.4 mg/dL (ref 8.7–10.3)
Chloride: 104 mmol/L (ref 96–106)
Creatinine, Ser: 0.65 mg/dL (ref 0.57–1.00)
Globulin, Total: 2.8 g/dL (ref 1.5–4.5)
Glucose: 96 mg/dL (ref 70–99)
Potassium: 4.8 mmol/L (ref 3.5–5.2)
Sodium: 143 mmol/L (ref 134–144)
Total Protein: 7.2 g/dL (ref 6.0–8.5)
eGFR: 99 mL/min/{1.73_m2} (ref >59)

## 2021-05-10 LAB — HEMOGLOBIN A1C
Est. average glucose Bld gHb Est-mCnc: 126 mg/dL
Hgb A1c MFr Bld: 6 % — ABNORMAL HIGH (ref 4.8–5.6)

## 2021-05-17 ENCOUNTER — Encounter (HOSPITAL_BASED_OUTPATIENT_CLINIC_OR_DEPARTMENT_OTHER): Payer: Self-pay | Admitting: Nurse Practitioner

## 2021-05-19 LAB — POCT UA - MICROALBUMIN
Albumin/Creatinine Ratio, Urine, POC: <30
Creatinine, POC: 200 mg/dL
Microalbumin Ur, POC: 10 mg/L

## 2021-05-19 NOTE — Assessment & Plan Note (Addendum)
Chronic.  Well-controlled today.  No alarm symptoms present at this time.  We will obtain labs for further evaluation today. ?Recommend continuation of carvedilol and lisinopril. ?Recommend low carbohydrate, low-fat, low-salt diet with increased exercise of at least 20 minutes daily and slowly increasing time and distance each week to a goal of 30 minutes at least 5 days a week. ?We will make changes to plan of care based on lab results as necessary. ?Plan to follow-up in 6 months or sooner if needed. ? ?

## 2021-05-19 NOTE — Assessment & Plan Note (Signed)
Chronic.  Not currently on statin therapy.  Patient declines this at this time.  Discussion on the importance of cholesterol management specifically in the setting of diabetes and hypertension as risks of cardiovascular degree disease are significantly increased with these comorbidities. ?Recommend continued diet and lifestyle modifications to help improve lipid levels. ?Recommend increase daily physical activity with a goal of at least 30 minutes of exercise at least 5 days a week. ?We will obtain labs today for further evaluation.  We will make changes to plan of care based on lab results. ?Plan to follow-up in 6 months for sooner if needed. ?

## 2021-05-19 NOTE — Assessment & Plan Note (Addendum)
Chronic.  Will obtain labs today for evaluation.  Recently labs have been fairly stable with diet control only.  Patient does not wish to go on medication at this time but wishes to continue to manage with her diet. ?Education and discussion on risks associated with uncontrolled diabetes and importance of close management pain control discussed. ?She is up-to-date on her eye exam.  We will need to obtain these records to update her chart. ?We will make changes to plan of care based on lab findings today. ?Foot exam and microalbumin completed today both of which were normal. ?Follow-up in approximately 6 months or sooner if needed. ?

## 2021-06-16 ENCOUNTER — Encounter (HOSPITAL_BASED_OUTPATIENT_CLINIC_OR_DEPARTMENT_OTHER): Payer: Self-pay | Admitting: Nurse Practitioner

## 2021-06-16 DIAGNOSIS — E1169 Type 2 diabetes mellitus with other specified complication: Secondary | ICD-10-CM

## 2021-06-18 ENCOUNTER — Other Ambulatory Visit (HOSPITAL_BASED_OUTPATIENT_CLINIC_OR_DEPARTMENT_OTHER): Payer: Self-pay

## 2021-06-19 MED ORDER — ROSUVASTATIN CALCIUM 5 MG PO TABS: 5.0000 mg | ORAL_TABLET | Freq: Every day | ORAL | 3 refills | Status: DC

## 2021-06-20 ENCOUNTER — Encounter (HOSPITAL_BASED_OUTPATIENT_CLINIC_OR_DEPARTMENT_OTHER): Payer: Self-pay | Admitting: Nurse Practitioner

## 2021-06-20 NOTE — Telephone Encounter (Signed)
Pt said she was not comfortable at all taking her cholesterol rx. She stated she is seeing two other doctors who are trying to protect her kidneys at all cost. She stated she would like to do some research and come up with another plan.  Please advise.  Thanks, [employer], CMA

## 2021-11-08 ENCOUNTER — Ambulatory Visit (INDEPENDENT_AMBULATORY_CARE_PROVIDER_SITE_OTHER): Payer: Commercial Managed Care - HMO | Admitting: Nurse Practitioner

## 2021-11-08 ENCOUNTER — Encounter (HOSPITAL_BASED_OUTPATIENT_CLINIC_OR_DEPARTMENT_OTHER): Payer: Self-pay | Admitting: Nurse Practitioner

## 2021-11-08 VITALS — BP 110/80 | HR 74 | Ht 68.0 in | Wt 212.0 lb

## 2021-11-08 DIAGNOSIS — E559 Vitamin D deficiency, unspecified: Secondary | ICD-10-CM

## 2021-11-08 DIAGNOSIS — R7303 Prediabetes: Secondary | ICD-10-CM | POA: Diagnosis not present

## 2021-11-08 DIAGNOSIS — E785 Hyperlipidemia, unspecified: Secondary | ICD-10-CM | POA: Diagnosis not present

## 2021-11-08 DIAGNOSIS — E1169 Type 2 diabetes mellitus with other specified complication: Secondary | ICD-10-CM

## 2021-11-08 DIAGNOSIS — M170 Bilateral primary osteoarthritis of knee: Secondary | ICD-10-CM | POA: Diagnosis not present

## 2021-11-08 DIAGNOSIS — I10 Essential (primary) hypertension: Secondary | ICD-10-CM | POA: Diagnosis not present

## 2021-11-08 MED ORDER — DULOXETINE HCL 60 MG PO CPEP: 60.0000 mg | ORAL_CAPSULE | Freq: Every day | ORAL | 3 refills | Status: DC

## 2021-11-08 MED ORDER — CARVEDILOL 12.5 MG PO TABS: 12.5000 mg | ORAL_TABLET | Freq: Two times a day (BID) | ORAL | 3 refills | Status: DC

## 2021-11-08 MED ORDER — ROSUVASTATIN CALCIUM 5 MG PO TABS: 5.0000 mg | ORAL_TABLET | Freq: Every day | ORAL | 3 refills | Status: DC

## 2021-11-08 MED ORDER — LISINOPRIL 40 MG PO TABS: 40.0000 mg | ORAL_TABLET | Freq: Every day | ORAL | 3 refills | Status: DC

## 2021-11-08 NOTE — Patient Instructions (Addendum)
You may want to consider trying Magnesium Glyconate for the leg cramps. This can be taken at bedtime. You can start with small doses ('200mg'$  a day) and increase if needed. This may help with the muscle cramping.   We will check your labs today to make sure everything looks ok.

## 2021-11-08 NOTE — Progress Notes (Addendum)
Diane Keeler, DNP, AGNP-c [facility] [address] [place], [place] [address] [phone] Office [phone] Fax  ESTABLISHED PATIENT- Chronic Health and/or Follow-Up Visit  Blood pressure 110/80, pulse 74, height '5\' 8"'$  (1.727 m), weight 212 lb (96.2 kg), last menstrual period 03/08/2010, SpO2 99 %.    Diane Clark is a 61 y.o. year old female presenting today for evaluation and management of the following: Follow-up (Patient presents today for follow up.)  She is currently dealing with some eye issues. She has been seeing an eye specialist and has had laser surgery which she tells me he told her she should not end up with glaucoma for at least 5 years.  She sees [facility] and she sees Dr. Katy Fitch for eye speciality.   She has had coffee with cream this morning.   She tells me that she has been getting leg cramps- she says when she gets these they are really bad. She will grab mustard or pickle juice to help with these when they happen.   There are no diagnoses linked to this encounter.   All ROS negative with exception of what is listed above.   PHYSICAL EXAM Physical Exam Vitals and nursing note reviewed.  Constitutional:      General: She is not in acute distress.    Appearance: Normal appearance.  HENT:     Head: Normocephalic.  Eyes:     Extraocular Movements: Extraocular movements intact.     Conjunctiva/sclera: Conjunctivae normal.     Pupils: Pupils are equal, round, and reactive to light.  Neck:     Vascular: No carotid bruit.  Cardiovascular:     Rate and Rhythm: Normal rate and regular rhythm.     Pulses: Normal pulses.     Heart sounds: Normal heart sounds. No murmur heard. Pulmonary:     Effort: Pulmonary effort is normal.     Breath sounds: Normal breath sounds. No wheezing.  Abdominal:     General: Bowel sounds are normal. There is no distension.     Palpations: Abdomen is soft.      Tenderness: There is no abdominal tenderness. There is no guarding.  Musculoskeletal:        General: Normal range of motion.     Cervical back: Normal range of motion and neck supple.     Right lower leg: No edema.     Left lower leg: No edema.  Lymphadenopathy:     Cervical: No cervical adenopathy.  Skin:    General: Skin is warm and dry.     Capillary Refill: Capillary refill takes less than 2 seconds.  Neurological:     General: No focal deficit present.     Mental Status: She is alert and oriented to person, place, and time.  Psychiatric:        Mood and Affect: Mood normal.        Behavior: Behavior normal.        Thought Content: Thought content normal.        Judgment: Judgment normal.     PLAN Problem List Items Addressed This Visit     Essential hypertension - Primary   Relevant Medications   carvedilol (COREG) 12.5 MG tablet   rosuvastatin (CRESTOR) 5 MG tablet   Other Relevant Orders   CBC With Diff/Platelet (Completed)   Comprehensive metabolic panel (Completed)   Hemoglobin A1c (Completed)   VITAMIN D 25 Hydroxy (Vit-D Deficiency, Fractures) (Completed)   LP+LDL  Direct (Completed)   Type 2 diabetes mellitus with hyperglycemia, without long-term current use of insulin (HCC)   Relevant Medications   rosuvastatin (CRESTOR) 5 MG tablet   Hyperlipidemia   Relevant Medications   carvedilol (COREG) 12.5 MG tablet   rosuvastatin (CRESTOR) 5 MG tablet   Other Relevant Orders   CBC With Diff/Platelet (Completed)   Comprehensive metabolic panel (Completed)   Hemoglobin A1c (Completed)   VITAMIN D 25 Hydroxy (Vit-D Deficiency, Fractures) (Completed)   LP+LDL Direct (Completed)   Osteoarthritis of knees, bilateral   Relevant Medications   DULoxetine (CYMBALTA) 60 MG capsule   Other Visit Diagnoses     Hyperlipidemia associated with type 2 diabetes mellitus ([place])       Relevant Medications   carvedilol (COREG) 12.5 MG tablet   rosuvastatin (CRESTOR) 5 MG  tablet   Vitamin D deficiency       Relevant Orders   VITAMIN D 25 Hydroxy (Vit-D Deficiency, Fractures) (Completed)       Return in about 6 months (around 05/10/2022) for Pre-DM, HTN, HLD.   Diane Keeler, DNP, AGNP-c 11/08/2021  9:09 AM

## 2021-11-09 LAB — COMPREHENSIVE METABOLIC PANEL
ALT: 23 IU/L (ref 0–32)
AST: 20 IU/L (ref 0–40)
Albumin/Globulin Ratio: 1.5 (ref 1.2–2.2)
Albumin: 4 g/dL (ref 3.9–4.9)
Alkaline Phosphatase: 71 IU/L (ref 44–121)
BUN/Creatinine Ratio: 14 (ref 12–28)
BUN: 9 mg/dL (ref 8–27)
Bilirubin Total: <0.2 mg/dL (ref 0.0–1.2)
CO2: 25 mmol/L (ref 20–29)
Calcium: 9.1 mg/dL (ref 8.7–10.3)
Chloride: 102 mmol/L (ref 96–106)
Creatinine, Ser: 0.66 mg/dL (ref 0.57–1.00)
Globulin, Total: 2.6 g/dL (ref 1.5–4.5)
Glucose: 96 mg/dL (ref 70–99)
Potassium: 4.3 mmol/L (ref 3.5–5.2)
Sodium: 140 mmol/L (ref 134–144)
Total Protein: 6.6 g/dL (ref 6.0–8.5)
eGFR: 99 mL/min/{1.73_m2} (ref >59)

## 2021-11-09 LAB — CBC WITH DIFF/PLATELET
Basophils Absolute: 0 10*3/uL (ref 0.0–0.2)
Basos: 1 %
EOS (ABSOLUTE): 0.2 10*3/uL (ref 0.0–0.4)
Eos: 4 %
Hematocrit: 38.3 % (ref 34.0–46.6)
Hemoglobin: 12.3 g/dL (ref 11.1–15.9)
Immature Grans (Abs): 0 10*3/uL (ref 0.0–0.1)
Immature Granulocytes: 0 %
Lymphocytes Absolute: 1.6 10*3/uL (ref 0.7–3.1)
Lymphs: 43 %
MCH: 29.5 pg (ref 26.6–33.0)
MCHC: 32.1 g/dL (ref 31.5–35.7)
MCV: 92 fL (ref 79–97)
Monocytes Absolute: 0.3 10*3/uL (ref 0.1–0.9)
Monocytes: 8 %
Neutrophils Absolute: 1.7 10*3/uL (ref 1.4–7.0)
Neutrophils: 44 %
Platelets: 210 10*3/uL (ref 150–450)
RBC: 4.17 x10E6/uL (ref 3.77–5.28)
RDW: 13.3 % (ref 11.7–15.4)
WBC: 3.7 10*3/uL (ref 3.4–10.8)

## 2021-11-09 LAB — HEMOGLOBIN A1C
Est. average glucose Bld gHb Est-mCnc: 123 mg/dL
Hgb A1c MFr Bld: 5.9 % — ABNORMAL HIGH (ref 4.8–5.6)

## 2021-11-09 LAB — LP+LDL DIRECT
Cholesterol, Total: 164 mg/dL (ref 100–199)
HDL: 84 mg/dL (ref >39)
LDL Chol Calc (NIH): 70 mg/dL (ref 0–99)
LDL Direct: 71 mg/dL (ref 0–99)
Triglycerides: 46 mg/dL (ref 0–149)
VLDL Cholesterol Cal: 10 mg/dL (ref 5–40)

## 2021-11-09 LAB — VITAMIN D 25 HYDROXY (VIT D DEFICIENCY, FRACTURES): Vit D, 25-Hydroxy: 52 ng/mL (ref 30.0–100.0)

## 2021-11-23 ENCOUNTER — Other Ambulatory Visit (HOSPITAL_BASED_OUTPATIENT_CLINIC_OR_DEPARTMENT_OTHER): Payer: Self-pay | Admitting: Nurse Practitioner

## 2021-11-23 DIAGNOSIS — I10 Essential (primary) hypertension: Secondary | ICD-10-CM

## 2021-12-14 ENCOUNTER — Encounter: Payer: Self-pay | Admitting: Nurse Practitioner

## 2022-01-06 ENCOUNTER — Other Ambulatory Visit (HOSPITAL_BASED_OUTPATIENT_CLINIC_OR_DEPARTMENT_OTHER): Payer: Self-pay | Admitting: Nurse Practitioner

## 2022-01-06 DIAGNOSIS — M170 Bilateral primary osteoarthritis of knee: Secondary | ICD-10-CM

## 2022-05-10 ENCOUNTER — Ambulatory Visit (HOSPITAL_BASED_OUTPATIENT_CLINIC_OR_DEPARTMENT_OTHER): Payer: Commercial Managed Care - HMO | Admitting: Nurse Practitioner

## 2022-05-25 LAB — HM PAP SMEAR

## 2022-05-31 LAB — HM DIABETES EYE EXAM

## 2022-06-14 ENCOUNTER — Ambulatory Visit (INDEPENDENT_AMBULATORY_CARE_PROVIDER_SITE_OTHER): Payer: Commercial Managed Care - HMO | Admitting: Family Medicine

## 2022-06-14 ENCOUNTER — Encounter (HOSPITAL_BASED_OUTPATIENT_CLINIC_OR_DEPARTMENT_OTHER): Payer: Self-pay | Admitting: Family Medicine

## 2022-06-14 VITALS — BP 113/72 | HR 78 | Ht 68.0 in | Wt 210.0 lb

## 2022-06-14 DIAGNOSIS — E785 Hyperlipidemia, unspecified: Secondary | ICD-10-CM

## 2022-06-14 DIAGNOSIS — I10 Essential (primary) hypertension: Secondary | ICD-10-CM

## 2022-06-14 DIAGNOSIS — Z1211 Encounter for screening for malignant neoplasm of colon: Secondary | ICD-10-CM | POA: Diagnosis not present

## 2022-06-14 DIAGNOSIS — R7303 Prediabetes: Secondary | ICD-10-CM | POA: Diagnosis not present

## 2022-06-14 NOTE — Assessment & Plan Note (Signed)
Blood pressure at goal in office today.  Patient continues with carvedilol and lisinopril, denies any issues with medications.  No symptoms of chest pain, headaches, lightheadedness or dizziness No changes to medication regimen today.  Recommend intermittent monitoring of blood pressure at home, DASH diet

## 2022-06-14 NOTE — Assessment & Plan Note (Signed)
Chart indicate about diagnosis of diabetes as well as prediabetes.  On review of prior hemoglobin A1c available for review, highest reading is 6.0%.  Patient denies ever being told of diabetes diagnosis.  She does have family history of diabetes. Believe the patient has current prediabetes, however do not feel that she has true diagnosis of diabetes at this time based on review of chart Discussed recommendations pertaining to lifestyle modifications, also reviewed potential use of metformin to help with controlling blood sugars and reduce risk of progression to diabetes.  Patient will focus on lifestyle modification at this time and we will monitor A1c in the future

## 2022-06-14 NOTE — Assessment & Plan Note (Signed)
Most recent lipid panel shows good control.  Patient has been taking rosuvastatin 5 mg, however reported that she noted some bodyaches and muscle pain and thus stopped medication.  She has questions today related to needing to continue with statin therapy or other recommendations Patient feels that she was not taking statin when most recent labs were drawn, however results would suggest that she was taking statin at that time.  We did review her history and risk factors related to potential adverse cardiovascular events.  Chart does indicate diagnosis of diabetes, however I do not see any results which would indicate diagnosis of diabetes and patient herself denies ever being told about diagnosis of diabetes. As such, her ASCVD risk score calculated to be 5.5%.  Discussed meaning of this as well as considerations related to further restratification with CAC scoring, consideration for statin therapy.  After discussion, patient elected to proceed with lifestyle modifications and holding off on statin therapy or further restratification at this time.  Will continue to monitor closely and plan to recheck lipid panel later this year

## 2022-06-14 NOTE — Progress Notes (Signed)
    Procedures performed today:    None.  Independent interpretation of notes and tests performed by another provider:   None.  Brief History, Exam, Impression, and Recommendations:    BP 113/72 (BP Location: Right Arm, Patient Position: Sitting, Cuff Size: Normal)   Pulse 78   Ht 5\' 8"  (1.727 m)   Wt 210 lb (95.3 kg)   LMP 03/08/2010   SpO2 98%   BMI 31.93 kg/m   Essential hypertension Blood pressure at goal in office today.  Patient continues with carvedilol and lisinopril, denies any issues with medications.  No symptoms of chest pain, headaches, lightheadedness or dizziness No changes to medication regimen today.  Recommend intermittent monitoring of blood pressure at home, DASH diet  Hyperlipidemia Most recent lipid panel shows good control.  Patient has been taking rosuvastatin 5 mg, however reported that she noted some bodyaches and muscle pain and thus stopped medication.  She has questions today related to needing to continue with statin therapy or other recommendations Patient feels that she was not taking statin when most recent labs were drawn, however results would suggest that she was taking statin at that time.  We did review her history and risk factors related to potential adverse cardiovascular events.  Chart does indicate diagnosis of diabetes, however I do not see any results which would indicate diagnosis of diabetes and patient herself denies ever being told about diagnosis of diabetes. As such, her ASCVD risk score calculated to be 5.5%.  Discussed meaning of this as well as considerations related to further restratification with CAC scoring, consideration for statin therapy.  After discussion, patient elected to proceed with lifestyle modifications and holding off on statin therapy or further restratification at this time.  Will continue to monitor closely and plan to recheck lipid panel later this year  Prediabetes Chart indicate about diagnosis of diabetes as  well as prediabetes.  On review of prior hemoglobin A1c available for review, highest reading is 6.0%.  Patient denies ever being told of diabetes diagnosis.  She does have family history of diabetes. Believe the patient has current prediabetes, however do not feel that she has true diagnosis of diabetes at this time based on review of chart Discussed recommendations pertaining to lifestyle modifications, also reviewed potential use of metformin to help with controlling blood sugars and reduce risk of progression to diabetes.  Patient will focus on lifestyle modification at this time and we will monitor A1c in the future  Return in about 4 months (around 10/15/2022) for HTN, HLD with [facility] a week prior.  Spent 32 minutes on this patient encounter, including preparation, chart review, face-to-face counseling with patient and coordination of care, and documentation of encounter   ___________________________________________ Diane [place], MD, ABFM, [facility] Primary Care and Sports Medicine [facility]

## 2022-06-18 ENCOUNTER — Encounter (HOSPITAL_BASED_OUTPATIENT_CLINIC_OR_DEPARTMENT_OTHER): Payer: Self-pay | Admitting: Family Medicine

## 2022-09-07 ENCOUNTER — Other Ambulatory Visit (HOSPITAL_BASED_OUTPATIENT_CLINIC_OR_DEPARTMENT_OTHER): Payer: Self-pay | Admitting: Family Medicine

## 2022-09-07 LAB — LIPID PANEL
Chol/HDL Ratio: 2.8 ratio (ref 0.0–4.4)
Cholesterol, Total: 229 mg/dL — ABNORMAL HIGH (ref 100–199)
Cholesterol: 229 — AB (ref 0–200)
HDL: 81 mg/dL (ref >39)
HDL: 81 — AB (ref 35–70)
LDL Chol Calc (NIH): 136 mg/dL — ABNORMAL HIGH (ref 0–99)
LDL Cholesterol: 136
Triglycerides: 69 mg/dL (ref 0–149)
VLDL Cholesterol Cal: 12 mg/dL (ref 5–40)

## 2022-09-07 LAB — HEMOGLOBIN A1C
Est. average glucose Bld gHb Est-mCnc: 123 mg/dL
Hgb A1c MFr Bld: 5.9 % — ABNORMAL HIGH (ref 4.8–5.6)

## 2022-09-11 ENCOUNTER — Encounter (HOSPITAL_BASED_OUTPATIENT_CLINIC_OR_DEPARTMENT_OTHER): Payer: Self-pay | Admitting: Family Medicine

## 2022-09-14 ENCOUNTER — Telehealth (HOSPITAL_BASED_OUTPATIENT_CLINIC_OR_DEPARTMENT_OTHER): Payer: Self-pay | Admitting: Family Medicine

## 2022-09-14 NOTE — Telephone Encounter (Signed)
Patient accidentally came in labs 09/07/22 a month early. Do you want to redo the labs 10/08/22 for her appointment on 10/15/22.  Also patient has had issue with rovostatin in the past and has spoke to you about previously and she is wanted to know what you can prescribe for her.  Please advise patient.

## 2022-09-18 NOTE — Telephone Encounter (Signed)
Spoke with patient, agreeable to keeping 09/30 appointment

## 2022-10-01 ENCOUNTER — Other Ambulatory Visit (HOSPITAL_BASED_OUTPATIENT_CLINIC_OR_DEPARTMENT_OTHER): Payer: Self-pay | Admitting: *Deleted

## 2022-10-01 DIAGNOSIS — I10 Essential (primary) hypertension: Secondary | ICD-10-CM

## 2022-10-01 MED ORDER — CARVEDILOL 12.5 MG PO TABS: 12.5000 mg | ORAL_TABLET | Freq: Two times a day (BID) | ORAL | 3 refills | Status: DC

## 2022-10-08 ENCOUNTER — Other Ambulatory Visit (HOSPITAL_BASED_OUTPATIENT_CLINIC_OR_DEPARTMENT_OTHER): Payer: Commercial Managed Care - HMO

## 2022-10-15 ENCOUNTER — Encounter (HOSPITAL_BASED_OUTPATIENT_CLINIC_OR_DEPARTMENT_OTHER): Payer: Self-pay | Admitting: Family Medicine

## 2022-10-15 ENCOUNTER — Ambulatory Visit (INDEPENDENT_AMBULATORY_CARE_PROVIDER_SITE_OTHER): Payer: Commercial Managed Care - HMO | Admitting: Family Medicine

## 2022-10-15 VITALS — BP 126/84 | HR 83 | Ht 67.5 in | Wt 211.6 lb

## 2022-10-15 DIAGNOSIS — E785 Hyperlipidemia, unspecified: Secondary | ICD-10-CM | POA: Diagnosis not present

## 2022-10-15 DIAGNOSIS — I10 Essential (primary) hypertension: Secondary | ICD-10-CM

## 2022-10-15 DIAGNOSIS — R7303 Prediabetes: Secondary | ICD-10-CM | POA: Diagnosis not present

## 2022-10-15 MED ORDER — ATORVASTATIN CALCIUM 10 MG PO TABS: 10.0000 mg | ORAL_TABLET | Freq: Every day | ORAL | 1 refills | Status: DC

## 2022-10-15 NOTE — Assessment & Plan Note (Signed)
Cholesterol panel does show elevation in total cholesterol as well as LDL.  Reviewed with patient today.  Prior controlled cholesterol panel likely was when she was taking low-dose of rosuvastatin.  She did have some possible side effects related to rosuvastatin.  Did discuss ASCVD risk score and that without statin therapy, her calculated score is about 19%.  Discussed meaning of this.  Given this elevation, would be reasonable to proceed with statin therapy.  We could consider alternative statin to see if this is better tolerated.  She was interested in this.  Will start with low-dose of atorvastatin and monitor tolerability as well as rechecking cholesterol panel with new medication.

## 2022-10-15 NOTE — Progress Notes (Signed)
    Procedures performed today:    None.  Independent interpretation of notes and tests performed by another provider:   None.  Brief History, Exam, Impression, and Recommendations:    BP 126/84 (BP Location: Right Arm, Patient Position: Sitting, Cuff Size: Normal)   Pulse 83   Ht 5' 7.5" (1.715 m)   Wt 211 lb 9.6 oz (96 kg)   LMP 03/08/2010   SpO2 97%   BMI 32.65 kg/m   Essential hypertension Assessment & Plan: Blood pressure borderline controlled in office today.  Patient continues with carvedilol and lisinopril, denies any issues with medications.  No symptoms of chest pain, headaches, lightheadedness or dizziness No changes to medication regimen today.  Recommend intermittent monitoring of blood pressure at home, DASH diet   Hyperlipidemia, unspecified hyperlipidemia type Assessment & Plan: Cholesterol panel does show elevation in total cholesterol as well as LDL.  Reviewed with patient today.  Prior controlled cholesterol panel likely was when she was taking low-dose of rosuvastatin.  She did have some possible side effects related to rosuvastatin.  Did discuss ASCVD risk score and that without statin therapy, her calculated score is about 19%.  Discussed meaning of this.  Given this elevation, would be reasonable to proceed with statin therapy.  We could consider alternative statin to see if this is better tolerated.  She was interested in this.  Will start with low-dose of atorvastatin and monitor tolerability as well as rechecking cholesterol panel with new medication.  Orders: -     Atorvastatin Calcium; Take 1 tablet (10 mg total) by mouth daily.  Dispense: 90 tablet; Refill: 1 -     Lipid panel; Future  Prediabetes Assessment & Plan: Recent labs showed stable hemoglobin A1c.  No concerns related to polyuria or polydipsia.  Continues with lifestyle modifications. At this time, can continue with intermittent monitoring of hemoglobin A1c.  Recommend lifestyle  modifications   Return in about 3 months (around 01/14/2023).   ___________________________________________ Diane Jasperson de [place], MD, ABFM, CAQSM Primary Care and Sports Medicine [facility]

## 2022-10-15 NOTE — Assessment & Plan Note (Signed)
Blood pressure borderline controlled in office today.  Patient continues with carvedilol and lisinopril, denies any issues with medications.  No symptoms of chest pain, headaches, lightheadedness or dizziness No changes to medication regimen today.  Recommend intermittent monitoring of blood pressure at home, DASH diet

## 2022-10-15 NOTE — Assessment & Plan Note (Signed)
Recent labs showed stable hemoglobin A1c.  No concerns related to polyuria or polydipsia.  Continues with lifestyle modifications. At this time, can continue with intermittent monitoring of hemoglobin A1c.  Recommend lifestyle modifications

## 2022-10-16 ENCOUNTER — Encounter (HOSPITAL_BASED_OUTPATIENT_CLINIC_OR_DEPARTMENT_OTHER): Payer: Self-pay | Admitting: *Deleted

## 2022-12-20 ENCOUNTER — Other Ambulatory Visit (HOSPITAL_BASED_OUTPATIENT_CLINIC_OR_DEPARTMENT_OTHER): Payer: Self-pay | Admitting: Nurse Practitioner

## 2022-12-20 DIAGNOSIS — I10 Essential (primary) hypertension: Secondary | ICD-10-CM

## 2023-01-15 ENCOUNTER — Other Ambulatory Visit (HOSPITAL_BASED_OUTPATIENT_CLINIC_OR_DEPARTMENT_OTHER): Payer: Commercial Managed Care - HMO

## 2023-01-15 DIAGNOSIS — R7303 Prediabetes: Secondary | ICD-10-CM

## 2023-01-15 DIAGNOSIS — E785 Hyperlipidemia, unspecified: Secondary | ICD-10-CM

## 2023-01-16 LAB — HEMOGLOBIN A1C
Est. average glucose Bld gHb Est-mCnc: 120 mg/dL
Hgb A1c MFr Bld: 5.8 % — ABNORMAL HIGH (ref 4.8–5.6)

## 2023-01-16 LAB — LIPID PANEL
Chol/HDL Ratio: 2 {ratio} (ref 0.0–4.4)
Cholesterol, Total: 181 mg/dL (ref 100–199)
HDL: 92 mg/dL (ref >39)
LDL Chol Calc (NIH): 77 mg/dL (ref 0–99)
Triglycerides: 63 mg/dL (ref 0–149)
VLDL Cholesterol Cal: 12 mg/dL (ref 5–40)

## 2023-01-21 ENCOUNTER — Ambulatory Visit (HOSPITAL_BASED_OUTPATIENT_CLINIC_OR_DEPARTMENT_OTHER): Payer: Commercial Managed Care - HMO | Admitting: Family Medicine

## 2023-01-22 ENCOUNTER — Ambulatory Visit (HOSPITAL_BASED_OUTPATIENT_CLINIC_OR_DEPARTMENT_OTHER): Payer: Commercial Managed Care - HMO | Admitting: Family Medicine

## 2023-01-28 ENCOUNTER — Ambulatory Visit (HOSPITAL_BASED_OUTPATIENT_CLINIC_OR_DEPARTMENT_OTHER): Payer: Commercial Managed Care - HMO | Admitting: Family Medicine

## 2023-01-28 ENCOUNTER — Encounter (HOSPITAL_BASED_OUTPATIENT_CLINIC_OR_DEPARTMENT_OTHER): Payer: Self-pay | Admitting: *Deleted

## 2023-02-04 ENCOUNTER — Telehealth (HOSPITAL_BASED_OUTPATIENT_CLINIC_OR_DEPARTMENT_OTHER): Payer: Self-pay | Admitting: *Deleted

## 2023-02-04 NOTE — Telephone Encounter (Signed)
LVM to inquire about mammogram. Has patient had mammogram and if so, where? If patient has not had mammogram would like to find out if patient would like to get this scheduled.

## 2023-02-06 ENCOUNTER — Encounter (HOSPITAL_BASED_OUTPATIENT_CLINIC_OR_DEPARTMENT_OTHER): Payer: Self-pay

## 2023-02-08 LAB — COLOGUARD: COLOGUARD: NEGATIVE

## 2023-02-14 ENCOUNTER — Encounter: Payer: Self-pay | Admitting: Obstetrics and Gynecology

## 2023-02-26 ENCOUNTER — Encounter (HOSPITAL_BASED_OUTPATIENT_CLINIC_OR_DEPARTMENT_OTHER): Payer: Self-pay | Admitting: Family Medicine

## 2023-02-26 ENCOUNTER — Ambulatory Visit (HOSPITAL_BASED_OUTPATIENT_CLINIC_OR_DEPARTMENT_OTHER): Payer: Commercial Managed Care - HMO | Admitting: Family Medicine

## 2023-02-26 DIAGNOSIS — R7303 Prediabetes: Secondary | ICD-10-CM

## 2023-02-26 DIAGNOSIS — I10 Essential (primary) hypertension: Secondary | ICD-10-CM

## 2023-02-26 DIAGNOSIS — E785 Hyperlipidemia, unspecified: Secondary | ICD-10-CM | POA: Diagnosis not present

## 2023-02-26 DIAGNOSIS — Z0000 Encounter for general adult medical examination without abnormal findings: Secondary | ICD-10-CM

## 2023-02-26 DIAGNOSIS — E1165 Type 2 diabetes mellitus with hyperglycemia: Secondary | ICD-10-CM

## 2023-02-26 NOTE — Assessment & Plan Note (Signed)
Recent labs showed stable hemoglobin A1c, has been gradually improving on results.  No concerns related to polyuria or polydipsia.  Continues with lifestyle modifications. Chart with prior indication of diabetes, however patient has never been told diagnosis and no labs in the past that are able to be reviewed in the chart would indicate diagnosis of diabetes. At this time, can continue with intermittent monitoring of hemoglobin A1c.  Recommend lifestyle modifications

## 2023-02-26 NOTE — Assessment & Plan Note (Signed)
At prior visit, did resume atorvastatin at 10 mg dose.  She reports that she has been doing well with medication.  She has had recent lipid panel which showed notable improvement in cholesterol numbers.  Given progress thus far and that she is tolerating medication, can continue with this going forward

## 2023-02-26 NOTE — Progress Notes (Signed)
    Procedures performed today:    None.  Independent interpretation of notes and tests performed by another provider:   None.  Brief History, Exam, Impression, and Recommendations:    BP 105/71 (BP Location: Right Arm, Patient Position: Sitting, Cuff Size: Normal)   Pulse 70   Ht 5\' 8"  (1.727 m)   Wt 216 lb (98 kg)   LMP 03/08/2010   SpO2 98%   BMI 32.84 kg/m   Type 2 diabetes mellitus with hyperglycemia, without long-term current use of insulin (HCC) -     Microalbumin / creatinine urine ratio  Hyperlipidemia, unspecified hyperlipidemia type Assessment & Plan: At prior visit, did resume atorvastatin at 10 mg dose.  She reports that she has been doing well with medication.  She has had recent lipid panel which showed notable improvement in cholesterol numbers.  Given progress thus far and that she is tolerating medication, can continue with this going forward   Prediabetes Assessment & Plan: Recent labs showed stable hemoglobin A1c, has been gradually improving on results.  No concerns related to polyuria or polydipsia.  Continues with lifestyle modifications. Chart with prior indication of diabetes, however patient has never been told diagnosis and no labs in the past that are able to be reviewed in the chart would indicate diagnosis of diabetes. At this time, can continue with intermittent monitoring of hemoglobin A1c.  Recommend lifestyle modifications   Essential hypertension Assessment & Plan: Blood pressure borderline controlled in office today.  Patient continues with carvedilol and lisinopril, denies any issues with medications.  No symptoms of chest pain, headaches, lightheadedness or dizziness No changes to medication regimen today.  Recommend intermittent monitoring of blood pressure at home, DASH diet   Wellness examination -     CBC with Differential/Platelet; Future -     Comprehensive metabolic panel; Future -     Hemoglobin A1c; Future -     Lipid panel;  Future -     TSH Rfx on Abnormal to Free T4; Future  Return in about 6 months (around 08/26/2023) for CPE with fasting labs 1 week prior.   ___________________________________________ Braelin Costlow de [place], MD, ABFM, CAQSM Primary Care and Sports Medicine [facility]

## 2023-02-26 NOTE — Patient Instructions (Signed)
  Medication Instructions:  Your physician recommends that you continue on your current medications as directed. Please refer to the Current Medication list given to you today. --If you need a refill on any your medications before your next appointment, please call your pharmacy first. If no refills are authorized on file call the office.-- Lab Work: Your physician has recommended that you have lab work today: 1 week before next visit  If you have labs (blood work) drawn today and your tests are completely normal, you will receive your results via MyChart message OR a phone call from our staff.  Please ensure you check your voicemail in the event that you authorized detailed messages to be left on a delegated number. If you have any lab test that is abnormal or we need to change your treatment, we will call you to review the results.   Follow-Up: Your next appointment:   Your physician recommends that you schedule a follow-up appointment in: 6 months physical with Dr. de Peru  You will receive a text message or e-mail with a link to a survey about your care and experience with [place] today! We would greatly appreciate your feedback!   Thanks for letting us be apart of your health journey!!  Primary Care and Sports Medicine   Dr. Ceasar Mons [place]   We encourage you to activate your patient portal called "MyChart".  Sign up information is provided on this After Visit Summary.  MyChart is used to connect with patients for Virtual Visits (Telemedicine).  Patients are able to view lab/test results, encounter notes, upcoming appointments, etc.  Non-urgent messages can be sent to your provider as well. To learn more about what you can do with MyChart, please visit --  [email or web address].

## 2023-02-26 NOTE — Assessment & Plan Note (Signed)
Blood pressure borderline controlled in office today.  Patient continues with carvedilol and lisinopril, denies any issues with medications.  No symptoms of chest pain, headaches, lightheadedness or dizziness No changes to medication regimen today.  Recommend intermittent monitoring of blood pressure at home, DASH diet

## 2023-02-27 LAB — MICROALBUMIN / CREATININE URINE RATIO
Creatinine, Urine: 47.9 mg/dL
Microalb/Creat Ratio: 8 mg/g{creat} (ref 0–29)
Microalbumin, Urine: 4 ug/mL

## 2023-02-28 ENCOUNTER — Encounter (HOSPITAL_BASED_OUTPATIENT_CLINIC_OR_DEPARTMENT_OTHER): Payer: Self-pay | Admitting: Family Medicine

## 2023-03-10 ENCOUNTER — Encounter (HOSPITAL_BASED_OUTPATIENT_CLINIC_OR_DEPARTMENT_OTHER): Payer: Self-pay | Admitting: Family Medicine

## 2023-03-20 ENCOUNTER — Other Ambulatory Visit (HOSPITAL_BASED_OUTPATIENT_CLINIC_OR_DEPARTMENT_OTHER): Payer: Self-pay | Admitting: Family Medicine

## 2023-03-20 ENCOUNTER — Other Ambulatory Visit (HOSPITAL_BASED_OUTPATIENT_CLINIC_OR_DEPARTMENT_OTHER): Payer: Self-pay | Admitting: Nurse Practitioner

## 2023-03-20 DIAGNOSIS — I10 Essential (primary) hypertension: Secondary | ICD-10-CM

## 2023-03-20 DIAGNOSIS — M170 Bilateral primary osteoarthritis of knee: Secondary | ICD-10-CM

## 2023-03-24 ENCOUNTER — Other Ambulatory Visit (HOSPITAL_BASED_OUTPATIENT_CLINIC_OR_DEPARTMENT_OTHER): Payer: Self-pay | Admitting: Family Medicine

## 2023-03-24 DIAGNOSIS — E785 Hyperlipidemia, unspecified: Secondary | ICD-10-CM

## 2023-03-25 ENCOUNTER — Telehealth (HOSPITAL_BASED_OUTPATIENT_CLINIC_OR_DEPARTMENT_OTHER): Payer: Self-pay | Admitting: *Deleted

## 2023-03-25 ENCOUNTER — Telehealth: Admitting: Physician Assistant

## 2023-03-25 DIAGNOSIS — J069 Acute upper respiratory infection, unspecified: Secondary | ICD-10-CM

## 2023-03-25 MED ORDER — BENZONATATE 100 MG PO CAPS
100.0000 mg | ORAL_CAPSULE | Freq: Three times a day (TID) | ORAL | 0 refills | Status: DC | PRN
Start: 2023-03-25 — End: 2023-08-27

## 2023-03-25 MED ORDER — FLUTICASONE PROPIONATE 50 MCG/ACT NA SUSP: 2.0000 | Freq: Every day | NASAL | 0 refills | Status: AC

## 2023-03-25 NOTE — Telephone Encounter (Signed)
 LVM for pt to call the office  Wanted to see if and when she had mammogram and if patient has not had mammogram would she like to go ahead and get this scheduled

## 2023-03-25 NOTE — Progress Notes (Signed)

## 2023-05-01 ENCOUNTER — Encounter (HOSPITAL_BASED_OUTPATIENT_CLINIC_OR_DEPARTMENT_OTHER): Payer: Self-pay | Admitting: Family Medicine

## 2023-05-24 ENCOUNTER — Encounter (HOSPITAL_BASED_OUTPATIENT_CLINIC_OR_DEPARTMENT_OTHER): Payer: Self-pay | Admitting: *Deleted

## 2023-06-18 ENCOUNTER — Other Ambulatory Visit (HOSPITAL_BASED_OUTPATIENT_CLINIC_OR_DEPARTMENT_OTHER): Payer: Self-pay | Admitting: Family Medicine

## 2023-06-18 DIAGNOSIS — I10 Essential (primary) hypertension: Secondary | ICD-10-CM

## 2023-06-20 DIAGNOSIS — Z79899 Other long term (current) drug therapy: Secondary | ICD-10-CM | POA: Diagnosis not present

## 2023-06-20 DIAGNOSIS — N135 Crossing vessel and stricture of ureter without hydronephrosis: Secondary | ICD-10-CM | POA: Diagnosis not present

## 2023-06-24 ENCOUNTER — Other Ambulatory Visit (HOSPITAL_BASED_OUTPATIENT_CLINIC_OR_DEPARTMENT_OTHER): Payer: Self-pay | Admitting: Family Medicine

## 2023-06-24 DIAGNOSIS — E785 Hyperlipidemia, unspecified: Secondary | ICD-10-CM

## 2023-08-22 ENCOUNTER — Other Ambulatory Visit (HOSPITAL_BASED_OUTPATIENT_CLINIC_OR_DEPARTMENT_OTHER): Payer: Self-pay | Admitting: *Deleted

## 2023-08-22 DIAGNOSIS — Z0000 Encounter for general adult medical examination without abnormal findings: Secondary | ICD-10-CM

## 2023-08-23 ENCOUNTER — Ambulatory Visit (HOSPITAL_BASED_OUTPATIENT_CLINIC_OR_DEPARTMENT_OTHER): Payer: Self-pay | Admitting: Family Medicine

## 2023-08-23 DIAGNOSIS — R7303 Prediabetes: Secondary | ICD-10-CM

## 2023-08-23 DIAGNOSIS — E66811 Obesity, class 1: Secondary | ICD-10-CM

## 2023-08-23 DIAGNOSIS — E785 Hyperlipidemia, unspecified: Secondary | ICD-10-CM

## 2023-08-23 LAB — COMPREHENSIVE METABOLIC PANEL WITH GFR
ALT: 24 IU/L (ref 0–32)
AST: 23 IU/L (ref 0–40)
Albumin: 4.3 g/dL (ref 3.9–4.9)
Alkaline Phosphatase: 93 IU/L (ref 44–121)
BUN/Creatinine Ratio: 20 (ref 12–28)
BUN: 11 mg/dL (ref 8–27)
Bilirubin Total: 0.4 mg/dL (ref 0.0–1.2)
CO2: 23 mmol/L (ref 20–29)
Calcium: 9.4 mg/dL (ref 8.7–10.3)
Chloride: 102 mmol/L (ref 96–106)
Creatinine, Ser: 0.55 mg/dL — ABNORMAL LOW (ref 0.57–1.00)
Globulin, Total: 2.7 g/dL (ref 1.5–4.5)
Glucose: 90 mg/dL (ref 70–99)
Potassium: 4.3 mmol/L (ref 3.5–5.2)
Sodium: 141 mmol/L (ref 134–144)
Total Protein: 7 g/dL (ref 6.0–8.5)
eGFR: 102 mL/min/1.73 (ref >59)

## 2023-08-23 LAB — LIPID PANEL
Chol/HDL Ratio: 2 ratio (ref 0.0–4.4)
Cholesterol, Total: 163 mg/dL (ref 100–199)
HDL: 82 mg/dL (ref >39)
LDL Chol Calc (NIH): 68 mg/dL (ref 0–99)
Triglycerides: 70 mg/dL (ref 0–149)
VLDL Cholesterol Cal: 13 mg/dL (ref 5–40)

## 2023-08-23 LAB — CBC WITH DIFFERENTIAL/PLATELET
Basophils Absolute: 0 x10E3/uL (ref 0.0–0.2)
Basos: 1 %
EOS (ABSOLUTE): 0.1 x10E3/uL (ref 0.0–0.4)
Eos: 3 %
Hematocrit: 42.9 % (ref 34.0–46.6)
Hemoglobin: 13.8 g/dL (ref 11.1–15.9)
Immature Grans (Abs): 0 x10E3/uL (ref 0.0–0.1)
Immature Granulocytes: 0 %
Lymphocytes Absolute: 1.6 x10E3/uL (ref 0.7–3.1)
Lymphs: 44 %
MCH: 30 pg (ref 26.6–33.0)
MCHC: 32.2 g/dL (ref 31.5–35.7)
MCV: 93 fL (ref 79–97)
Monocytes Absolute: 0.3 x10E3/uL (ref 0.1–0.9)
Monocytes: 9 %
Neutrophils Absolute: 1.5 x10E3/uL (ref 1.4–7.0)
Neutrophils: 43 %
Platelets: 240 x10E3/uL (ref 150–450)
RBC: 4.6 x10E6/uL (ref 3.77–5.28)
RDW: 13.2 % (ref 11.7–15.4)
WBC: 3.5 x10E3/uL (ref 3.4–10.8)

## 2023-08-23 LAB — HEMOGLOBIN A1C
Est. average glucose Bld gHb Est-mCnc: 117 mg/dL
Hgb A1c MFr Bld: 5.7 % — ABNORMAL HIGH (ref 4.8–5.6)

## 2023-08-23 LAB — TSH RFX ON ABNORMAL TO FREE T4: TSH: 1.34 u[IU]/mL (ref 0.450–4.500)

## 2023-08-27 ENCOUNTER — Ambulatory Visit (INDEPENDENT_AMBULATORY_CARE_PROVIDER_SITE_OTHER): Admitting: Family Medicine

## 2023-08-27 ENCOUNTER — Encounter (HOSPITAL_BASED_OUTPATIENT_CLINIC_OR_DEPARTMENT_OTHER): Payer: Self-pay | Admitting: Family Medicine

## 2023-08-27 ENCOUNTER — Encounter (HOSPITAL_BASED_OUTPATIENT_CLINIC_OR_DEPARTMENT_OTHER): Payer: Commercial Managed Care - HMO | Admitting: Family Medicine

## 2023-08-27 VITALS — BP 108/71 | HR 62 | Temp 98.0°F | Resp 18 | Ht 68.0 in | Wt 213.0 lb

## 2023-08-27 DIAGNOSIS — E785 Hyperlipidemia, unspecified: Secondary | ICD-10-CM | POA: Diagnosis not present

## 2023-08-27 DIAGNOSIS — M170 Bilateral primary osteoarthritis of knee: Secondary | ICD-10-CM | POA: Diagnosis not present

## 2023-08-27 DIAGNOSIS — I10 Essential (primary) hypertension: Secondary | ICD-10-CM

## 2023-08-27 DIAGNOSIS — Z0000 Encounter for general adult medical examination without abnormal findings: Secondary | ICD-10-CM | POA: Diagnosis not present

## 2023-08-27 DIAGNOSIS — Z13820 Encounter for screening for osteoporosis: Secondary | ICD-10-CM

## 2023-08-27 DIAGNOSIS — E66811 Obesity, class 1: Secondary | ICD-10-CM | POA: Insufficient documentation

## 2023-08-27 MED ORDER — DULOXETINE HCL 60 MG PO CPEP: 60.0000 mg | ORAL_CAPSULE | Freq: Every day | ORAL | 1 refills | Status: AC

## 2023-08-27 MED ORDER — ZEPBOUND 2.5 MG/0.5ML ~~LOC~~ SOAJ: 2.5000 mg | SUBCUTANEOUS | 1 refills | Status: DC

## 2023-08-27 MED ORDER — LISINOPRIL 40 MG PO TABS: 40.0000 mg | ORAL_TABLET | Freq: Every day | ORAL | 1 refills | Status: AC

## 2023-08-27 MED ORDER — ATORVASTATIN CALCIUM 10 MG PO TABS: 10.0000 mg | ORAL_TABLET | Freq: Every day | ORAL | 1 refills | Status: AC

## 2023-08-27 MED ORDER — CARVEDILOL 12.5 MG PO TABS: 12.5000 mg | ORAL_TABLET | Freq: Two times a day (BID) | ORAL | 3 refills | Status: AC

## 2023-08-27 NOTE — Assessment & Plan Note (Signed)
 Long discussion today reviewing weight loss medications including injectable options including GLP-1 receptor agonist, oral agents including Contrave, orlistat, phentermine.  We discussed potential risk, benefits, cost associated with these various medications as well as the possibility of insurance coverage or lack thereof.  We discussed typical dosing regimen for both injectable and oral medications, proper administration of each.  We discussed potential outcomes with each. After discussion of potential risks and adverse reactions with these medications and potential benefits of each, patient would like to proceed with injectable GLP-1 receptor agonist if possible.  Prescription sent to pharmacy on file, if medication is cost prohibitive for patient, she will let us  know and we can look to send an alternative option.  Will plan for close follow-up to monitor response to whichever medication patient is able to initiate. Additional consideration is for referral to healthy weight and wellness clinic

## 2023-08-27 NOTE — Assessment & Plan Note (Signed)
 Routine HCM labs reviewed. HCM reviewed/discussed. Anticipatory guidance regarding healthy weight, lifestyle and choices given. Recommend healthy diet.  Recommend approximately 150 minutes/week of moderate intensity exercise Recommend regular dental and vision exams Always use seatbelt/lap and shoulder restraints Recommend using smoke alarms and checking batteries at least twice a year Recommend using sunscreen when outside Overall, well-appearing female Patient was counseled, risk factors were discussed, anticipatory guidance given. Discussed colon cancer screening recommendations, options.  Patient is UTD Discussed immunization recommendations

## 2023-08-27 NOTE — Progress Notes (Signed)
 Subjective:    CC: Annual Physical Exam  HPI: Diane Clark is a 63 y.o. presenting for annual physical  I reviewed the past medical history, family history, social history, surgical history, and allergies today and no changes were needed.  Please see the problem list section below in epic for further details.  Past Medical History: Past Medical History:  Diagnosis Date   Cellulitis 03/2017   Right ear   Hydronephrosis    Bilateral   Hypertension 2000   HYPERTENSION, BENIGN SYSTEMIC 03/14/2006   AA with no history of DM or CKD. On ACEi, BB and HCTZ chronically. Took off HCTZ due to borderline K. She was also on metoprolol  ER 100 mg twice a day. No History of MI or HF.  Down-titrating metoprolol  with a plan to stop if BP stable.     OA (osteoarthritis) rheumotologist-  dr a. ishmael   knees, shoulders   Obesity    Retroperitoneal fibrosis    on Methotrexate for   Seizures (HCC)    with fever. Last one age 25   Ureteral obstruction    bilaterally--- secondary to hx retroperitoneal fibroids-- treatment indwelling ureteral stents   Uterine fibroid    Past Surgical History: Past Surgical History:  Procedure Laterality Date   CERVICAL CONE BIOPSY  1990   CYSTOSCOPY W/ URETERAL STENT PLACEMENT Bilateral 01/02/2017   Procedure: CYSTOSCOPY WITH STENT REPLACEMENT;  Surgeon: Devere Lonni Righter, MD;  Location: [facility];  Service: Urology;  Laterality: Bilateral;  ONLY NEEDS 30 MIN   CYSTOSCOPY W/ URETERAL STENT PLACEMENT Bilateral 08/09/2017   Procedure: CYSTOSCOPY WITH CLEDA RAV;  Surgeon: Devere Lonni Righter, MD;  Location: [facility];  Service: Urology;  Laterality: Bilateral;  ONLY NEEDS 30 MIN   CYSTOSCOPY W/ URETERAL STENT PLACEMENT Bilateral 11/22/2017   Procedure: CYSTOSCOPY WITH RETROGRADE PYELOGRAM/URETERAL STENT PLACEMENT;  Surgeon: Devere Lonni Righter, MD;  Location: [facility];  Service:  Urology;  Laterality: Bilateral;   CYSTOSCOPY WITH RETROGRADE PYELOGRAM, URETEROSCOPY AND STENT PLACEMENT Bilateral 11/09/2016   Procedure: CYSTOSCOPY WITH RETROGRADE PYELOGRAM, BILATERAL URETEROSCOPY,   AND BILATERAL STENT PLACEMENT;  Surgeon: Devere Lonni Righter, MD;  Location: [facility];  Service: Urology;  Laterality: Bilateral;   CYSTOSCOPY WITH STENT PLACEMENT Bilateral 04/17/2017   Procedure: CYSTOSCOPY/ RETORGRADE/ STENT EXCHANGE;  Surgeon: Devere Lonni Righter, MD;  Location: [facility];  Service: Urology;  Laterality: Bilateral;  ONLY NEEDS 30 MIN   ROBOTIC XI URETEROLYSIS     March 2020   Social History: Social History   Socioeconomic History   Marital status: Single    Spouse name: Not on file   Number of children: 0   Years of education: Not on file   Highest education level: Some college, no degree  Occupational History   Occupation: Comptroller: [employer]  Tobacco Use   Smoking status: Former    Current packs/day: 0.00    Average packs/day: 1 pack/day for 10.0 years (10.0 ttl pk-yrs)    Types: Cigarettes    Start date: 12/13/1976    Quit date: 12/14/1986    Years since quitting: 36.7    Passive exposure: Past   Smokeless tobacco: Never  Vaping Use   Vaping status: Never Used  Substance and Sexual Activity   Alcohol use: Yes    Alcohol/week: 1.0 standard drink of alcohol    Types: 1 Glasses of wine per week    Comment: occ wine   Drug  use: No   Sexual activity: Not Currently    Birth control/protection: Post-menopausal  Other Topics Concern   Not on file  Social History Narrative   Single no kids   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   2 EtOH/day   No drugs/tobacco   4 caffeine/day      12/13/2016   Social Drivers of Health   Financial Resource Strain: Medium Risk (08/26/2023)   Overall Financial Resource Strain (CARDIA)    Difficulty of Paying Living Expenses: Somewhat  hard  Food Insecurity: Food Insecurity Present (08/26/2023)   Hunger Vital Sign    Worried About Running Out of Food in the Last Year: Sometimes true    Ran Out of Food in the Last Year: Sometimes true  Transportation Needs: No Transportation Needs (08/26/2023)   PRAPARE - Administrator, Civil Service (Medical): No    Lack of Transportation (Non-Medical): No  Physical Activity: Insufficiently Active (08/26/2023)   Exercise Vital Sign    Days of Exercise per Week: 3 days    Minutes of Exercise per Session: 20 min  Stress: No Stress Concern Present (08/26/2023)   [employer] of Occupational Health - Occupational Stress Questionnaire    Feeling of Stress: Not at all  Social Connections: Moderately Isolated (08/26/2023)   Social Connection and Isolation Panel    Frequency of Communication with Friends and Family: More than three times a week    Frequency of Social Gatherings with Friends and Family: Once a week    Attends Religious Services: More than 4 times per year    Active Member of [employer] or Organizations: No    Attends Engineer, structural: Not on file    Marital Status: Never married   Family History: Family History  Problem Relation Age of Onset   Kidney failure Mother    Diabetes Mother    Heart disease Mother    Kidney failure Sister        kidney transplant   Hypertension Sister    Pancreatic cancer Brother    Colon cancer Neg Hx    Esophageal cancer Neg Hx    Rectal cancer Neg Hx    Stomach cancer Neg Hx    Allergies: Allergies  Allergen Reactions   Lidocaine  Swelling    Pt states  the site where local lidocaine  placed-swelling occurred briefly   Medications: See med rec.  Review of Systems: No headache, visual changes, nausea, vomiting, diarrhea, constipation, dizziness, abdominal pain, skin rash, fevers, chills, night sweats, swollen lymph nodes, weight loss, chest pain, body aches, joint swelling, muscle aches, shortness of breath,  mood changes, visual or auditory hallucinations.  Objective:    BP 108/71   Pulse 62   Temp 98 F (36.7 C) (Oral)   Resp 18   Ht 5' 8 (1.727 m)   Wt 213 lb (96.6 kg)   LMP 03/08/2010   SpO2 97%   BMI 32.39 kg/m   General: Well Developed, well nourished, and in no acute distress.  Neuro: Alert and oriented x3, extra-ocular muscles intact, sensation grossly intact. Cranial nerves II through XII are intact, motor, sensory, and coordinative functions are all intact. HEENT: Normocephalic, atraumatic, pupils equal round reactive to light, neck supple, no masses, no lymphadenopathy, thyroid  nonpalpable. Oropharynx, nasopharynx, external ear canals are unremarkable. Skin: Warm and dry, no rashes noted.  Cardiac: Regular rate and rhythm, no murmurs rubs or gallops.  Respiratory: Clear to auscultation bilaterally. Not using accessory  muscles, speaking in full sentences.  Abdominal: Soft, nontender, nondistended, positive bowel sounds, no masses, no organomegaly.  Musculoskeletal: Shoulder, elbow, wrist, hip, knee, ankle stable, and with full range of motion.  Impression and Recommendations:    Wellness examination Assessment & Plan: Routine HCM labs reviewed. HCM reviewed/discussed. Anticipatory guidance regarding healthy weight, lifestyle and choices given. Recommend healthy diet.  Recommend approximately 150 minutes/week of moderate intensity exercise Recommend regular dental and vision exams Always use seatbelt/lap and shoulder restraints Recommend using smoke alarms and checking batteries at least twice a year Recommend using sunscreen when outside Overall, well-appearing female Patient was counseled, risk factors were discussed, anticipatory guidance given. Discussed colon cancer screening recommendations, options.  Patient is UTD Discussed immunization recommendations   Hyperlipidemia, unspecified hyperlipidemia type -     Atorvastatin  Calcium ; Take 1 tablet (10 mg total) by  mouth daily.  Dispense: 90 tablet; Refill: 1  Essential hypertension -     Carvedilol ; Take 1 tablet (12.5 mg total) by mouth 2 (two) times daily with a meal.  Dispense: 180 tablet; Refill: 3 -     Lisinopril ; Take 1 tablet (40 mg total) by mouth daily.  Dispense: 90 tablet; Refill: 1 -     Zepbound ; Inject 2.5 mg into the skin once a week.  Dispense: 2 mL; Refill: 1  Osteoarthritis of both knees, unspecified osteoarthritis type -     DULoxetine  HCl; Take 1 capsule (60 mg total) by mouth daily.  Dispense: 90 capsule; Refill: 1  Osteoporosis screening -     DG Bone Density; Future  Obesity, Class I, BMI 30-34.9 Assessment & Plan: Long discussion today reviewing weight loss medications including injectable options including GLP-1 receptor agonist, oral agents including Contrave, orlistat, phentermine.  We discussed potential risk, benefits, cost associated with these various medications as well as the possibility of insurance coverage or lack thereof.  We discussed typical dosing regimen for both injectable and oral medications, proper administration of each.  We discussed potential outcomes with each. After discussion of potential risks and adverse reactions with these medications and potential benefits of each, patient would like to proceed with injectable GLP-1 receptor agonist if possible.  Prescription sent to pharmacy on file, if medication is cost prohibitive for patient, she will let us  know and we can look to send an alternative option.  Will plan for close follow-up to monitor response to whichever medication patient is able to initiate. Additional consideration is for referral to healthy weight and wellness clinic  Orders: -     Zepbound ; Inject 2.5 mg into the skin once a week.  Dispense: 2 mL; Refill: 1  Return in about 6 weeks (around 10/08/2023) for med check.    ___________________________________________ Absalom Aro de [place], MD, ABFM, CAQSM Primary Care and Sports Medicine [facility]

## 2023-09-05 ENCOUNTER — Telehealth (HOSPITAL_BASED_OUTPATIENT_CLINIC_OR_DEPARTMENT_OTHER): Payer: Self-pay | Admitting: *Deleted

## 2023-09-05 NOTE — Telephone Encounter (Signed)
 Patient does not have a sleep apnea diagnosis will not be able to appeal

## 2023-09-05 NOTE — Telephone Encounter (Signed)
 Copied from CRM #[id number]. Topic: Clinical - Medication Prior Auth >> Sep 04, 2023  5:16 PM Winona R wrote: Alenne D from [facility]- calling with denial for pts zepbound  because the medication is anti obesity medication and excluded from part D prescription coverage. However the FDA has approved Zepbound  if it is to treat moderate to severe sleep apnea in adults with obesity. The provider may file an appeal or call [phone]

## 2023-09-09 MED ORDER — SEMAGLUTIDE-WEIGHT MANAGEMENT 0.25 MG/0.5ML ~~LOC~~ SOAJ: 0.2500 mg | SUBCUTANEOUS | 1 refills | Status: AC

## 2023-09-13 ENCOUNTER — Encounter (HOSPITAL_BASED_OUTPATIENT_CLINIC_OR_DEPARTMENT_OTHER): Payer: Self-pay | Admitting: Family Medicine

## 2023-10-02 ENCOUNTER — Encounter (HOSPITAL_BASED_OUTPATIENT_CLINIC_OR_DEPARTMENT_OTHER): Payer: Self-pay | Admitting: *Deleted

## 2023-10-08 ENCOUNTER — Ambulatory Visit (HOSPITAL_BASED_OUTPATIENT_CLINIC_OR_DEPARTMENT_OTHER): Admitting: Family Medicine

## 2024-03-12 ENCOUNTER — Ambulatory Visit (HOSPITAL_BASED_OUTPATIENT_CLINIC_OR_DEPARTMENT_OTHER): Admitting: Family Medicine
